# Patient Record
Sex: Female | Born: 1955 | Race: White | Hispanic: No | Marital: Married
Health system: Midwestern US, Community
[De-identification: ages and names within clinical notes are randomized; demographics above are authoritative.]

---

## 2018-07-27 DIAGNOSIS — R197 Diarrhea, unspecified: Secondary | ICD-10-CM

## 2018-07-27 NOTE — ED Notes (Signed)
Pt to er 18 via ems from home for c/o syncopal epidsode- EMS states pt has been dizzy all day and has had high blood pressure. Pt states she has had  Dizziness and diarrhea all month. Pt not sure if she hit her head- husband at bedside stated she was found on floor in hallway. EMS states her bp was 190/102 and gluc was 211.

## 2018-07-27 NOTE — ED Notes (Signed)
I have reviewed discharge instructions with the patient and spouse.  The patient and spouse verbalized understanding.

## 2018-07-27 NOTE — ED Provider Notes (Signed)
C/O DIARRHEA, SOFT AT TIMES WATERY STOOLS SINCE END OF AUGUST. NO FEVER/CHILLS, NO NAUSEA/VOMITING, NO CHANGE IN APPETITE, NO WEIGHT LOSS. NO BLOOD/MUCUS IN STOOLS. NO FOLLOWUP WITH PCP, HAS TAKEN OTC LOMOTIL. NO RELIEF. TODAY FELT LIGHTHEADED. AND TINGLING SENSATIONS IN FACE AND HADS, RESOLVED. LASTED FOR A FEW MINUTES.            Past Medical History:   Diagnosis Date   ??? CAD (coronary artery disease)    ??? Diabetes (HCC)     IIDM   ??? Hypertension    ??? Stroke [facility])        Past Surgical History:   Procedure Laterality Date   ??? CARDIAC SURG PROCEDURE UNLIST      heart stent x1   ??? HX CHOLECYSTECTOMY     ??? HX GYN      hysterectomy   ??? HX ORTHOPAEDIC      right hip replacement   ??? 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 No pertinent family history.    Social History     Socioeconomic History   ??? Marital status: MARRIED     Spouse name: Not on file   ??? Number of children: Not on file   ??? Years of education: Not on file   ??? Highest education level: Not on file   Occupational History   ??? Not on file   Social Needs   ??? Financial resource strain: Not on file   ??? Food insecurity:     Worry: Not on file     Inability: Not on file   ??? Transportation needs:     Medical: Not on file     Non-medical: Not on file   Tobacco Use   ??? Smoking status: Current Every Day Smoker     Packs/day: 2.00     Years: 35.00     Pack years: 70.00   ??? Smokeless tobacco: Never Used   Substance and Sexual Activity   ??? Alcohol use: Not Currently   ??? Drug use: Never   ??? Sexual activity: Not on file   Lifestyle   ??? Physical activity:     Days per week: Not on file     Minutes per session: Not on file   ??? Stress: Not on file   Relationships   ??? Social connections:     Talks on phone: Not on file     Gets together: Not on file     Attends religious service: Not on file     Active member of club or organization: Not on file     Attends meetings of clubs or organizations: Not on file     Relationship  status: Not on file   ??? Intimate partner violence:     Fear of current or ex partner: Not on file     Emotionally abused: Not on file     Physically abused: Not on file     Forced sexual activity: Not on file   Other Topics Concern   ??? Not on file   Social History Narrative   ??? Not on file         ALLERGIES: Patient has no known allergies.    Review of Systems   Gastrointestinal: Positive for diarrhea.   Neurological: Positive for dizziness.   All other systems reviewed and are negative.      Vitals:  07/27/18 2115 07/27/18 2130 07/27/18 2141 07/27/18 2145   BP: 117/54 117/55  134/63   Pulse: 68 70 68 71   Resp: 17 12 22 26    Temp:       SpO2: 93% 92% 95% 93%   Weight:       Height:                Physical Exam   Constitutional: She is oriented to person, place, and time. She appears well-developed and well-nourished. She is active.   HENT:   Head: Normocephalic.   Eyes: Pupils are equal, round, and reactive to light.   Cardiovascular: Normal rate, regular rhythm, normal heart sounds, intact distal pulses and normal pulses.   Pulmonary/Chest: Effort normal and breath sounds normal.   Abdominal: Soft. Bowel sounds are normal.   Neurological: She is alert and oriented to person, place, and time. She has normal strength.   Skin: Skin is warm, dry and intact. Capillary refill takes less than 2 seconds.   Psychiatric: She has a normal mood and affect. Her speech is normal and behavior is normal.        MDM  Number of Diagnoses or Management Options  Diagnosis management comments: PT REFUSES LABS, IV FLUIDS. INFORMED HER OF POSSIBILITY OF ABNORMAL ELECTROLYTES. PT WANTS MEDS AND WILL F/U WITH PCP IN AM. PT UNDERSTANDS RISKS OF WORSENING SYMPTOMS, DISABILITY.          Procedures

## 2018-07-27 NOTE — ED Provider Notes (Signed)
C/O DIARRHEA, SOFT AT TIMES WATERY STOOLS SINCE END OF AUGUST. NO FEVER/CHILLS, NO NAUSEA/VOMITING, NO CHANGE IN APPETITE, NO WEIGHT LOSS. NO BLOOD/MUCUS IN STOOLS. NO FOLLOWUP WITH PCP, HAS TAKEN OTC LOMOTIL. NO RELIEF. TODAY FELT LIGHTHEADED. AND TINGLING SENSATIONS IN FACE AND HADS, RESOLVED. LASTED FOR A FEW MINUTES.            Past Medical History:   Diagnosis Date   ??? CAD (coronary artery disease)    ??? Diabetes (HCC)     IIDM   ??? Hypertension    ??? Stroke [facility])        Past Surgical History:   Procedure Laterality Date   ??? CARDIAC SURG PROCEDURE UNLIST      heart stent x1   ??? HX CHOLECYSTECTOMY     ??? HX GYN      hysterectomy   ??? HX ORTHOPAEDIC      right hip replacement   ??? 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 No pertinent family history.    Social History     Socioeconomic History   ??? Marital status: MARRIED     Spouse name: Not on file   ??? Number of children: Not on file   ??? Years of education: Not on file   ??? Highest education level: Not on file   Occupational History   ??? Not on file   Social Needs   ??? Financial resource strain: Not on file   ??? Food insecurity:     Worry: Not on file     Inability: Not on file   ??? Transportation needs:     Medical: Not on file     Non-medical: Not on file   Tobacco Use   ??? Smoking status: Current Every Day Smoker     Packs/day: 2.00     Years: 35.00     Pack years: 70.00   ??? Smokeless tobacco: Never Used   Substance and Sexual Activity   ??? Alcohol use: Not Currently   ??? Drug use: Never   ??? Sexual activity: Not on file   Lifestyle   ??? Physical activity:     Days per week: Not on file     Minutes per session: Not on file   ??? Stress: Not on file   Relationships   ??? Social connections:     Talks on phone: Not on file     Gets together: Not on file     Attends religious service: Not on file     Active member of club or organization: Not on file     Attends meetings of clubs or organizations: Not on file      Relationship status: Not on file   ??? Intimate partner violence:     Fear of current or ex partner: Not on file     Emotionally abused: Not on file     Physically abused: Not on file     Forced sexual activity: Not on file   Other Topics Concern   ??? Not on file   Social History Narrative   ??? Not on file         ALLERGIES: Patient has no known allergies.    Review of Systems   Gastrointestinal: Positive for diarrhea.   Neurological: Positive for dizziness.   All other systems reviewed and are negative.      Vitals:  07/27/18 2115 07/27/18 2130 07/27/18 2141 07/27/18 2145   BP: 117/54 117/55  134/63   Pulse: 68 70 68 71   Resp: 17 12 22 26    Temp:       SpO2: 93% 92% 95% 93%   Weight:       Height:                Physical Exam   Constitutional: She is oriented to person, place, and time. She appears well-developed and well-nourished. She is active.   HENT:   Head: Normocephalic.   Eyes: Pupils are equal, round, and reactive to light.   Cardiovascular: Normal rate, regular rhythm, normal heart sounds, intact distal pulses and normal pulses.   Pulmonary/Chest: Effort normal and breath sounds normal.   Abdominal: Soft. Bowel sounds are normal.   Neurological: She is alert and oriented to person, place, and time. She has normal strength.   Skin: Skin is warm, dry and intact. Capillary refill takes less than 2 seconds.   Psychiatric: She has a normal mood and affect. Her speech is normal and behavior is normal.        MDM  Number of Diagnoses or Management Options  Diagnosis management comments: PT REFUSES LABS, IV FLUIDS. INFORMED HER OF POSSIBILITY OF ABNORMAL ELECTROLYTES. PT WANTS MEDS AND WILL F/U WITH PCP IN AM. PT UNDERSTANDS RISKS OF WORSENING SYMPTOMS, DISABILITY.          Procedures

## 2018-07-27 NOTE — ED Triage Notes (Addendum)
Pt to er 18 via ems from home for c/o syncopal epidsode- EMS states pt has been dizzy all day and has had high blood pressure. Pt states she has had  Dizziness and diarrhea all month. Pt not sure if she hit her head- husband at bedside stated she was found on floor in hallway. EMS states her bp was 190/102 and gluc was 211.

## 2018-07-28 ENCOUNTER — Inpatient Hospital Stay
Admit: 2018-07-28 | Discharge: 2018-07-28 | Disposition: A | Discharge status: Home / Self Care | Payer: BLUE CROSS/BLUE SHIELD | Attending: Emergency Medicine

## 2018-07-28 LAB — EKG 12-LEAD
Atrial Rate: 69 {beats}/min
Diagnosis: NORMAL
P Axis: 37 degrees
P-R Interval: 188 ms
Q-T Interval: 418 ms
QRS Duration: 88 ms
QTc Calculation (Bazett): 447 ms
R Axis: -5 degrees
T Axis: 46 degrees
Ventricular Rate: 69 {beats}/min

## 2018-07-28 LAB — EKG, 12 LEAD, INITIAL
Atrial Rate: 69 {beats}/min
Calculated P Axis: 37 degrees
Calculated R Axis: -5 degrees
Calculated T Axis: 46 degrees
Diagnosis: NORMAL
P-R Interval: 188 ms
Q-T Interval: 418 ms
QRS Duration: 88 ms
QTC Calculation (Bezet): 447 ms
Ventricular Rate: 69 {beats}/min

## 2018-07-28 MED ORDER — DIPHENOXYLATE-ATROPINE 2.5 MG-0.025 MG TAB
ORAL_TABLET | Freq: Three times a day (TID) | ORAL | 0 refills | Status: AC | PRN
Start: 2018-07-28 — End: ?

## 2020-02-26 IMAGING — CT CT Lumbar Spine W-O Contrast
3 of 4 series · 12 of 33 positions shown, 14 images · IV contrast (agent unspecified)
Comparison: none

CT Lumbar Spine W-O Contrast
INDICATION: Pain.                                                                        
 Pertinent History: Numbness and tightness to right foot, radiating up to knee. Symptoms   
 have resolved.   Diabetes, hypertension, neuropathy, fibromyalgia CAD, PVD, CVA, non      
 compliant with medications.                                                               
 Surgical History: Right hip, hysterectomy, left groin-multiple times due to necrotizing   
 fascitis.                                                                                 
 Cancer: None                                                                              
 Intravenous contrast: None                                                                
 Technologist Comments: None
TECHNIQUE: Routine without IV contrast.  Helical acquisition with sagittal and coronal    
 reformats.                                                                                
 Utilized dose reduction techniques include: Automated Exposure Control, vendor specific   
 iterative reconstruction technique

[Series 4: ax pre st · axial · non-contrast · 0.29mm/px · z∈[+581,+763]mm · 4 of 137 slices shown, 5 images]
[im 23/137  soft-tissue]
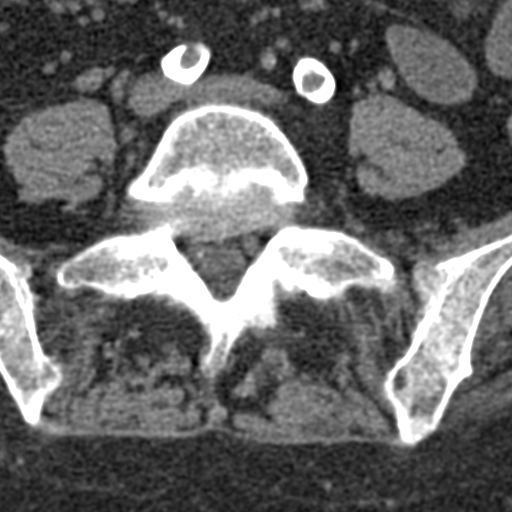
[im 23/137  bone]
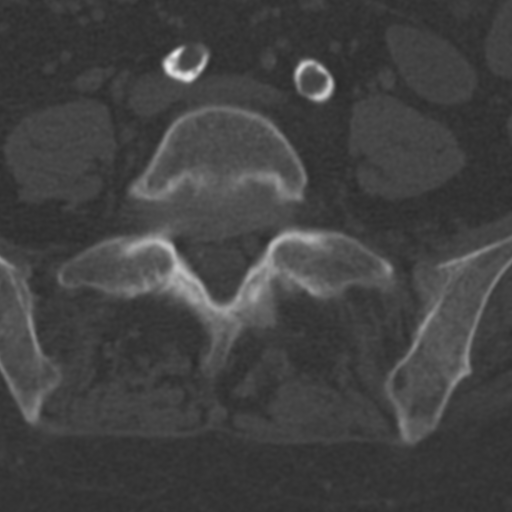
[im 46/137  bone]
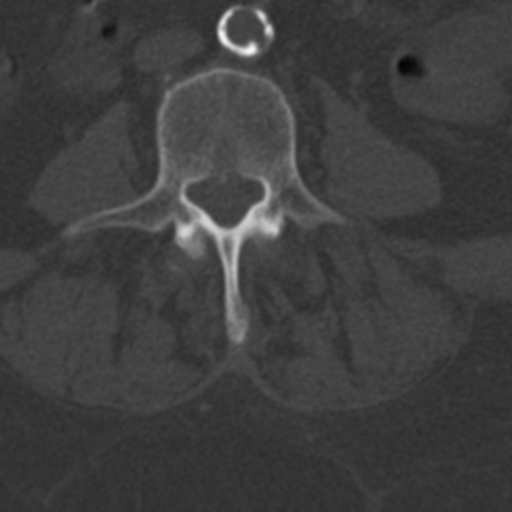
[im 91/137  bone]
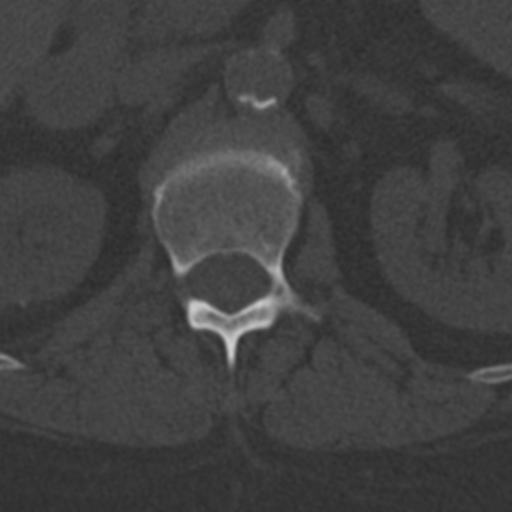
[im 114/137  bone]
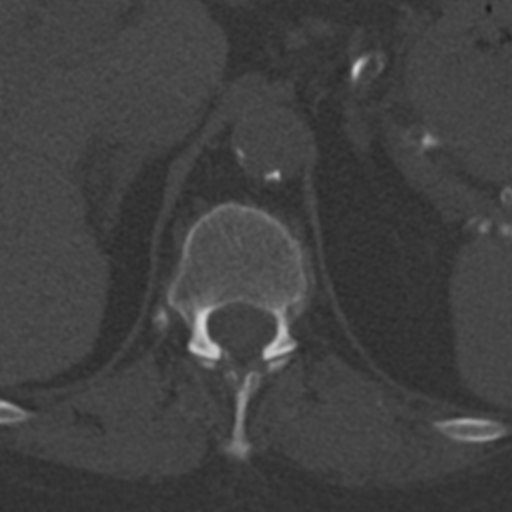

[Series 5: cor pre · coronal · non-contrast · 0.28mm/px · 3 of 63 slices shown]
[im 13/63  bone]
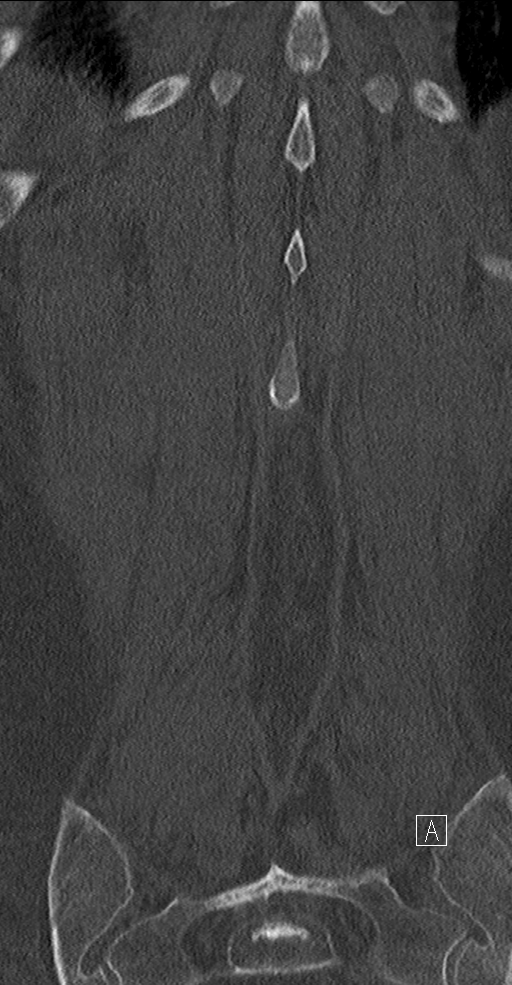
[im 25/63  bone]
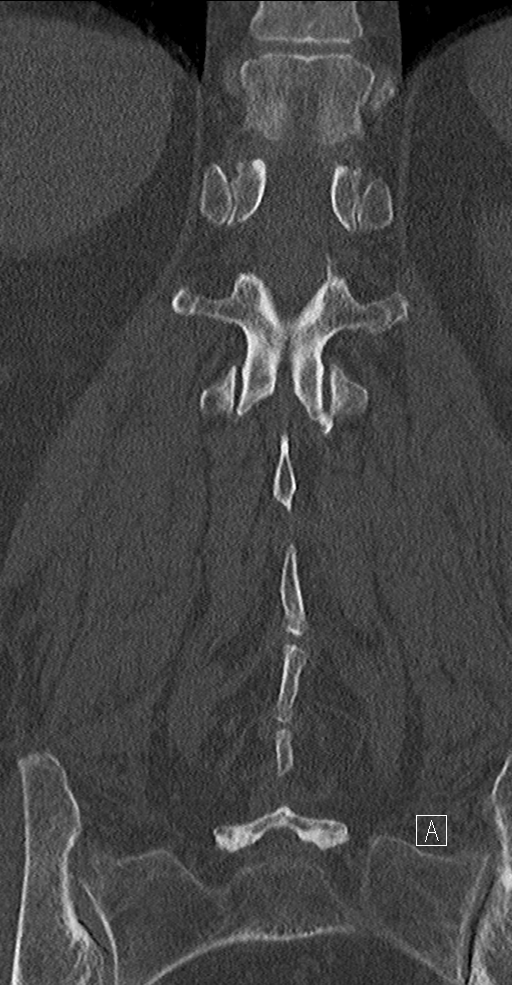
[im 38/63  bone]
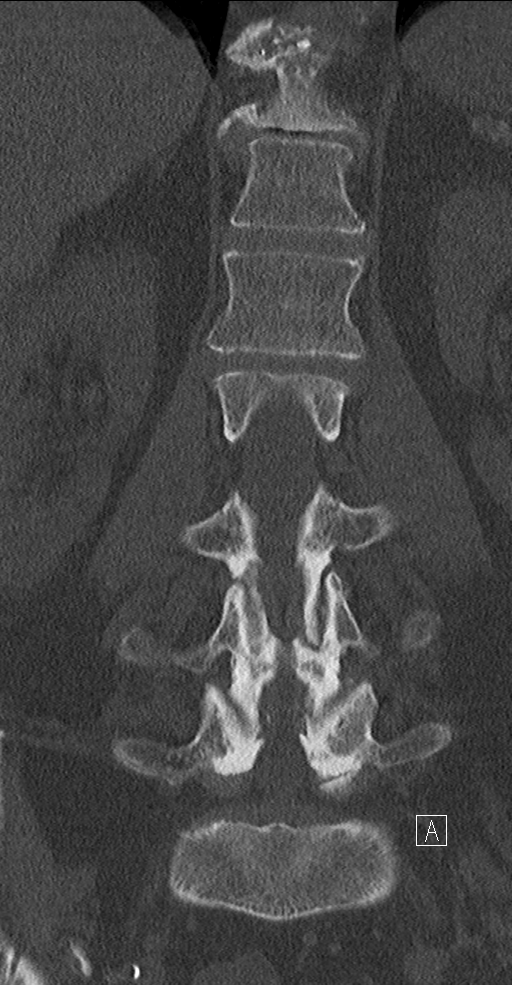

[Series 6: sag pre · sagittal · non-contrast · 0.50mm/px · 5 of 69 slices shown, 6 images]
[im 23/69  bone]
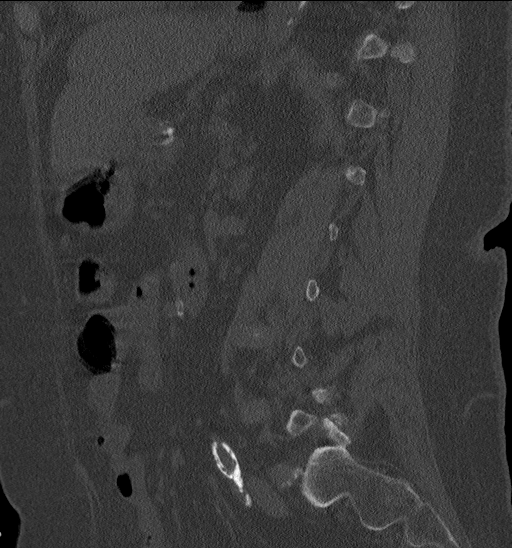
[im 29/69  bone]
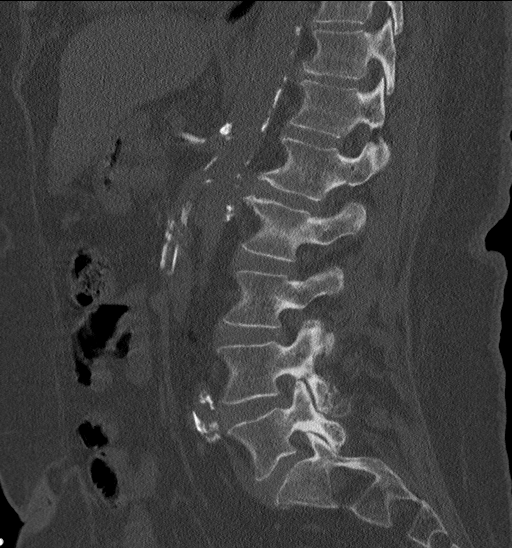
[im 35/69  soft-tissue]
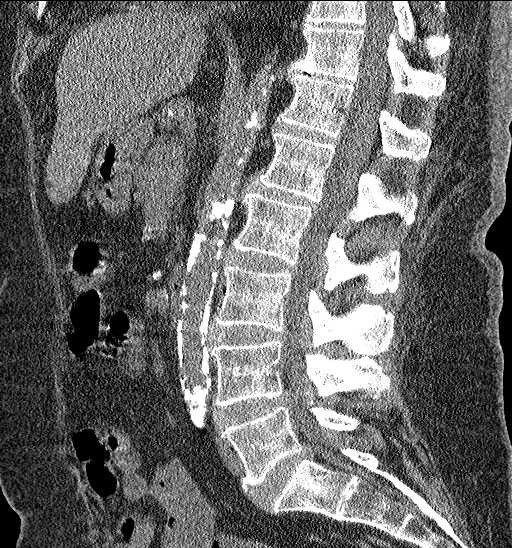
[im 35/69  bone]
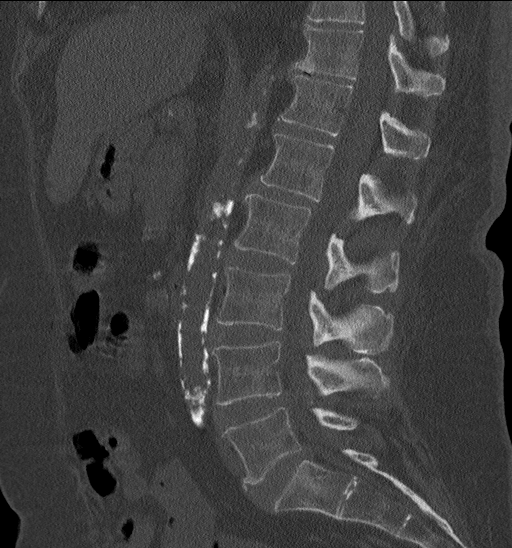
[im 40/69  bone]
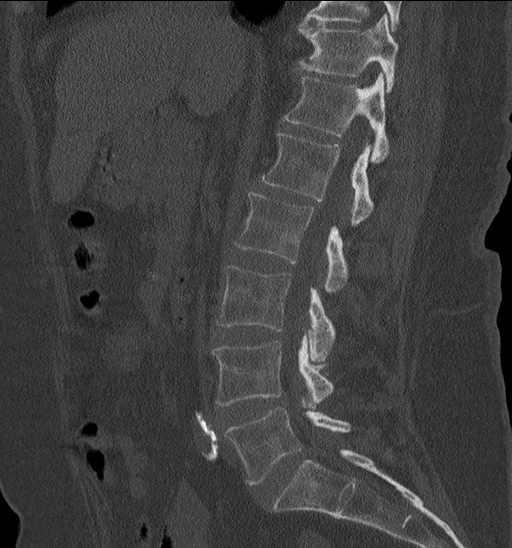
[im 46/69  bone]
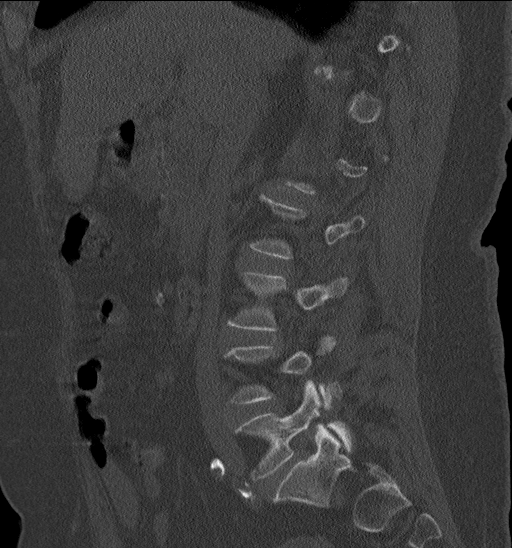

[12 of 33 positions shown; findings below may reference images not displayed]

FINDINGS: Lumbar vertebral bodies are normal in height. There is no evidence of an acute            
 fracture.                                                                                 
 Spine labeling assumes there are 5 lumbar type vertebral bodies. There is minimal grade 1 
 L1 on L2 retrolisthesis. Extensive calcific aortoiliac atherosclerosis with chronic       
 occlusion/near occlusion of the origin of the right common iliac artery.                  
 Mild to moderate anterior spondylosis at T10 T11 and T11-T12. Minimal anterior            
 spondylosis at T12-L1. L3-L4 through L5-S1 facet arthropathy and ligamentum flavum        
 thickening. There are small disc bulges at L3-L4 and L4-L5 with mild narrowing of the     
 central canal and neural foramina.                                                        
 Disc bulge at L5-S1 facet arthropathy results in mild foraminal stenosis at this level as 
 well.                                                                                     
 Degenerative changes present within the sacroiliac joints.
IMPRESSION: 1. Mild multilevel lumbar spondylosis.                                                    
 2. No acute fracture.                                                                     
 3. Extensive calcific aortoiliac atherosclerosis with chronic occlusion/near occlusion of 
 the origin of the right iliac artery.

## 2020-04-05 IMAGING — CT CTA Abdomen Aorta-Runoff
2 of 4 series · 9 of 16 positions shown, 14 images · IV contrast (omnipaque)
Comparison: none

CTA Abdomen Aorta-Runoff
INDICATION: Claudication.                                                                
 Pertinent History: patient reports numbness, tingling, pains  Diabetes, hypertension,     
 neuropathy, fibromyalgia CAD, PVD, CVA, non compliant with medications.                   
 Surgical History: Right hip, hysterectomy, left groin-multiple times due to necrotizing   
 fascitis.                                                                                 
 Cancer: None                                                                              
 GFR (past 30 days): >39 ml/min                                                            
 Metformin: None                                                                           
 Lipase: N/A       Amylase: N/A      WBC: N/A                                              
 IV Contrast: 100 mL Omnipaque 350                                                         
 Technologist Comments: None
TECHNIQUE: Helical acquisition with sagittal and coronal reformations; post processing on 
 independent workstation including 3-D volume rendering; pre and post IV contrast          
 images.                                                                                   
 Utilized dose reduction techniques include: Automated Exposure Control, vendor specific   
 iterative reconstruction technique

[Series 4: ax post (id) · axial · 0.82mm/px · z∈[-1034,-62]mm · 7 of 866 slices shown, 12 images]
[im 109/866  soft-tissue]
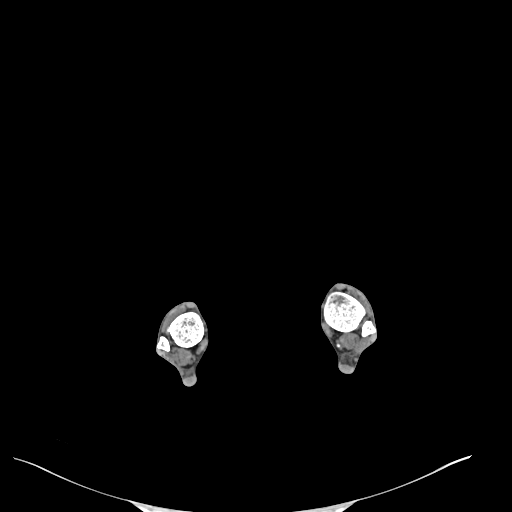
[im 109/866  bone]
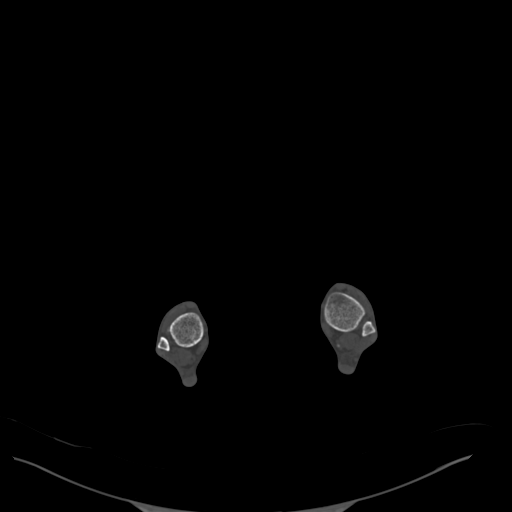
[im 217/866  soft-tissue]
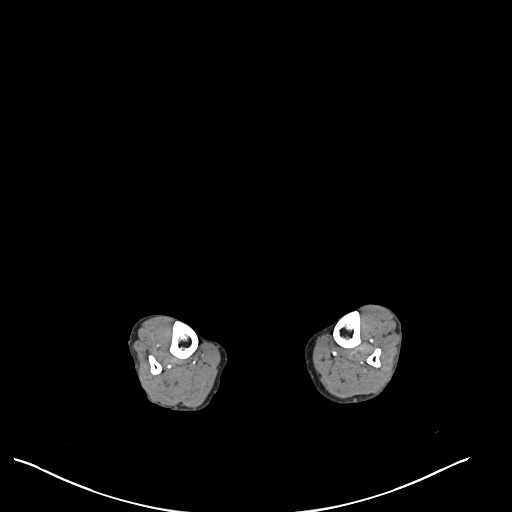
[im 325/866  soft-tissue]
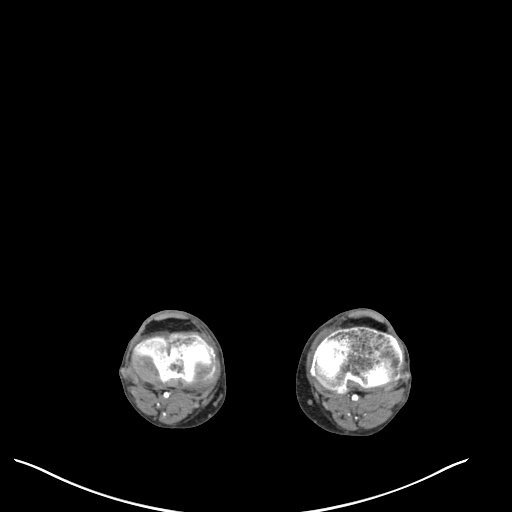
[im 433/866  soft-tissue]
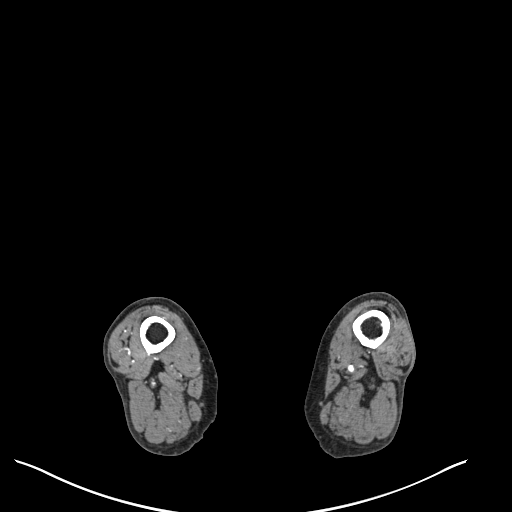
[im 433/866  lung]
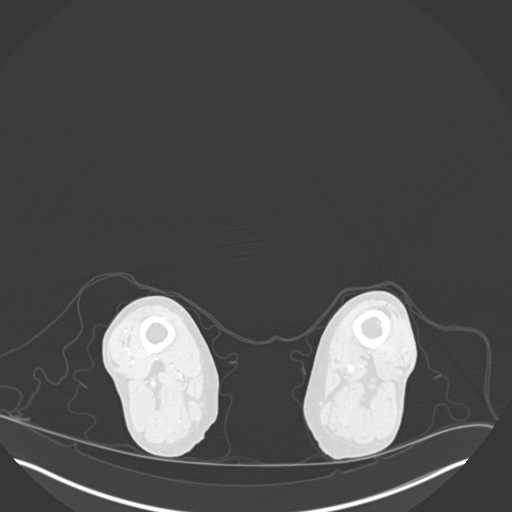
[im 541/866  soft-tissue]
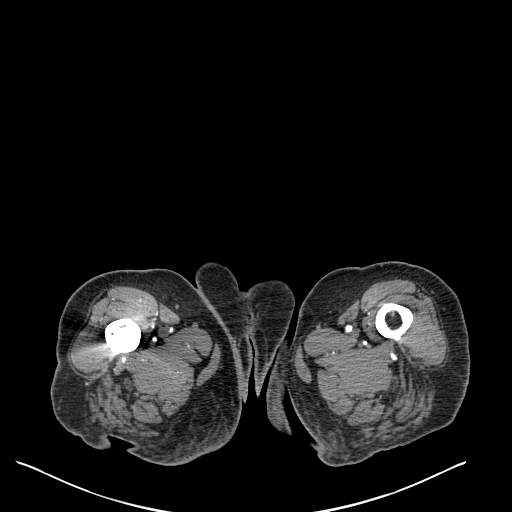
[im 541/866  lung]
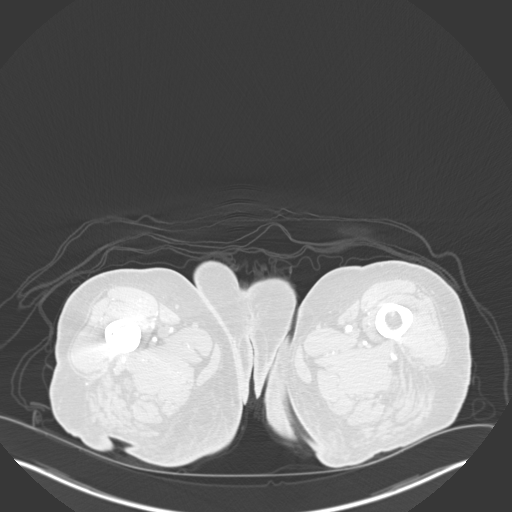
[im 649/866  soft-tissue]
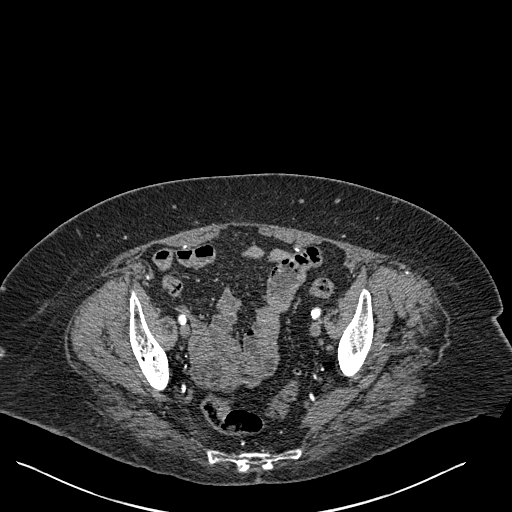
[im 649/866  lung]
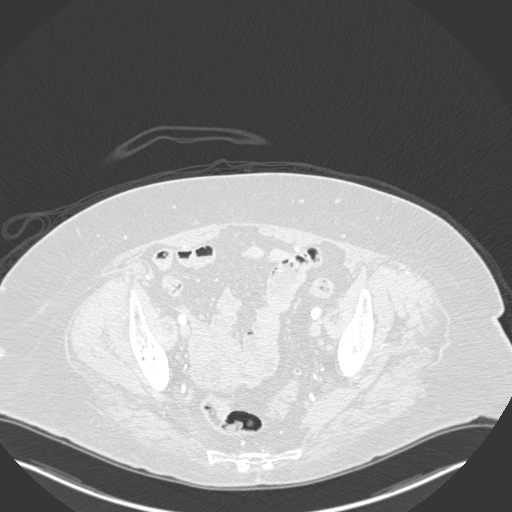
[im 757/866  soft-tissue]
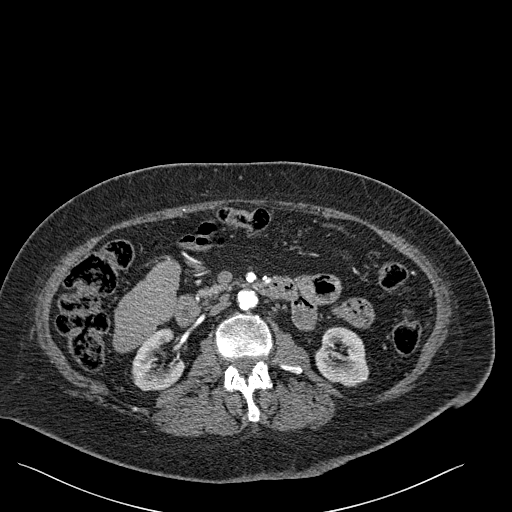
[im 757/866  lung]
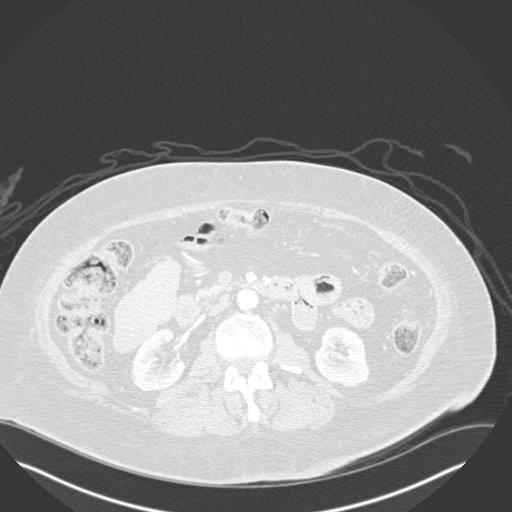

[Series 6: delayed calves · axial · 0.70mm/px · z∈[-1002,-804]mm · 2 of 398 slices shown]
[im 133/398  soft-tissue]
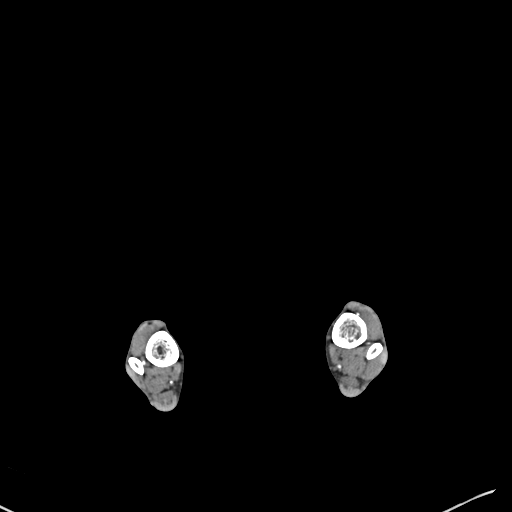
[im 265/398  soft-tissue]
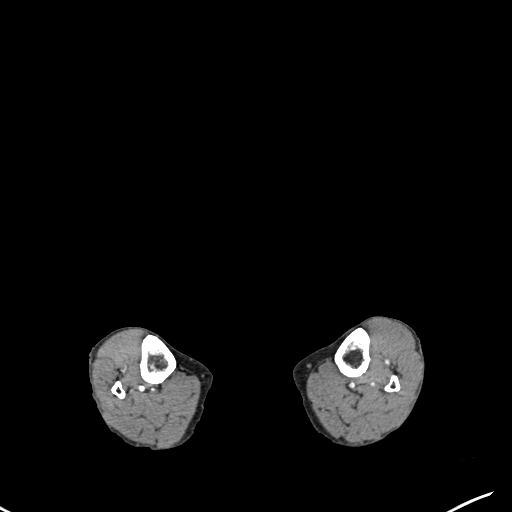

[9 of 16 positions shown; findings below may reference images not displayed]

FINDINGS: There is moderate calcified plaque within the abdominal aorta. There is calcified plaque  
 at the origin of the celiac artery and SMA without definite stenosis. There is moderate   
 calcified plaque at origin left renal artery without stenosis. The right middle artery is 
 patent. The inferior mesenteric artery is patent.                                         
 There is an inferior right accessory renal artery.                                        
 Right leg: There is a short segment occlusion is 99% stenosis of the proximal right       
 common iliac artery, there is reconstitution of the right common iliac artery distally.   
 There is heavy calcified plaque throughout the right common iliac artery, internal iliac  
 artery and external iliac artery with a probable moderate-severe stenosis at the origin   
 of the right external iliac artery. There is also a likely moderate-severe stenosis in    
 the distal right external iliac artery. There is moderate calcified plaque with probable  
 50% stenosis in the right common femoral artery. The right profundofemoral artery is      
 patent. There is a greater than 90% stenosis at the origin of the right SFA with several  
 additional severe tandem stenoses proximally and at the mid aspect. The right SFA         
 occludes distally. The right popliteal artery is patent. There is minimal calcified       
 plaque in the below the knee right popliteal artery. The trifurcation vessels are patent  
 proximally. The NG tube artery is small in size includes proximally. The differential can 
 include a congenital variant. The peroneal artery is hypertrophic and continues evidence  
 to the talus pedis at the ankle and foot. The posterior tibial artery also continues into 
 the foot.                                                                                 
 Left leg: There is moderate calcified plaque in the left common iliac artery which likely 
 results in a 60% stenosis. There is a moderate stenosis at the origin of the left         
 internal iliac artery. There is a severe stenosis at the origin of the left external      
 iliac artery. There is heavy calcified plaque in the left common femoral artery which     
 results in 50% stenosis. The left profundofemoral artery is patent.                       
 There is a 70% stenosis at the origin of the left SFA. There is a tandem 70% stenosis in  
 the proximal left SFA. The left popliteal artery is patent. The trifurcation vessels are  
 patent proximally. The trifurcation vessels continue into the foot.                       
 There is a right hip arthroplasty.                                                        
 Additional findings:                                                                      
 The heart is not enlarged. There is a small hiatal hernia.                                
 The liver has a nodular contour consistent with cirrhosis. The spleen is heterogeneous    
 enhancement. The spleen is normal in size. There is mild nodular thickening of both       
 adrenal glands.                                                                           
 The pancreas is unremarkable.                                                             
 The gallbladder surgically absent. There is moderate extrahepatic biliary duct dilatation 
 most consistent with a cholecystectomy state.                                             
 The kidneys are symmetric in size. It appears somewhat small in size. There is mild       
 cortical thinning. This calcification noted in the right renal parenchyma. There is no    
 collecting system stone. There is no hydronephrosis or hydroureter. The urinary bladder   
 is collapsed. The uterus is surgically absent. The adnexa are surgically absent.          
 There is a tiny fat-containing periumbilical hernia.                                      
 There are no abnormally dilated small or large bowel loops. There are scattered colonic   
 diverticula without evidence of diverticulitis. The appendix is normal.                   
 There is no significant retroperitoneal or mesenteric lymphadenopathy. There is no free   
 fluid or free air.                                                                        
 The vertebral body heights are maintained.
IMPRESSION: 1. Right leg: 99% stenosis versus short segment occlusion of the proximal right common    
 iliac artery. Moderate-severe stenoses noted in the right external iliac artery. 90%      
 stenosis at the origin of the right SFA. The right SFA occludes distally. The popliteal   
 artery is patent with minimal atherosclerotic plaque. The posterior tibial artery and     
 peroneal artery are patent and continue to the foot.                                      
 2. Left leg: 60% stenosis in the left common iliac artery. Severe stenosis in the         
 proximal left external iliac artery. 70% stenosis at the origin of the left SFA.          
 Additional 70% stenoses noted in the proximal left SFA. The popliteal artery is patent.   
 There is three-vessel runoff to left foot.                                                
 3. Nodular contour to the liver suggesting cirrhosis.                                     
 4. Colonic diverticula.

## 2020-07-07 IMAGING — MR MR Brain W W-O Contrast
8 of 14 series · 11 of 48 positions shown · IV contrast (DOTAREM)
Comparison: 01/06/2014

MR Brain W W-O Contrast
HISTORY: Dizziness
TECHNIQUE: Sagittal T1.                                                                              
 Axial T1, T2, FLAIR, GRE T2, diffusion.                                                   
 Coronal post contrast fat sat T1. Axial 3D GRE post contrast T1.                          
 Contrast: 16 cc Dotarem IV

[Series 5: DWI · axial · 5.0mm · 1.20mm/px · z∈[-69,+71]mm · 2 of 50 slices shown (1 of 2)]
[im 1/50]
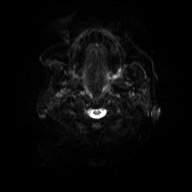
[im 50/50]
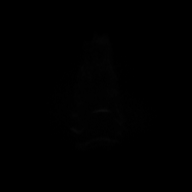

[Series 6: DWI · axial · 5.0mm · 1.20mm/px · 1 of 25 slices shown (2 of 2)]
[im 1/25]
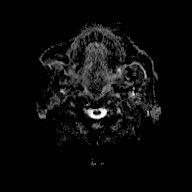

[Series 7: FLAIR · sagittal · 5.0mm · 0.38mm/px · 1 of 23 slices shown (1 of 2)]
[im 1/23]
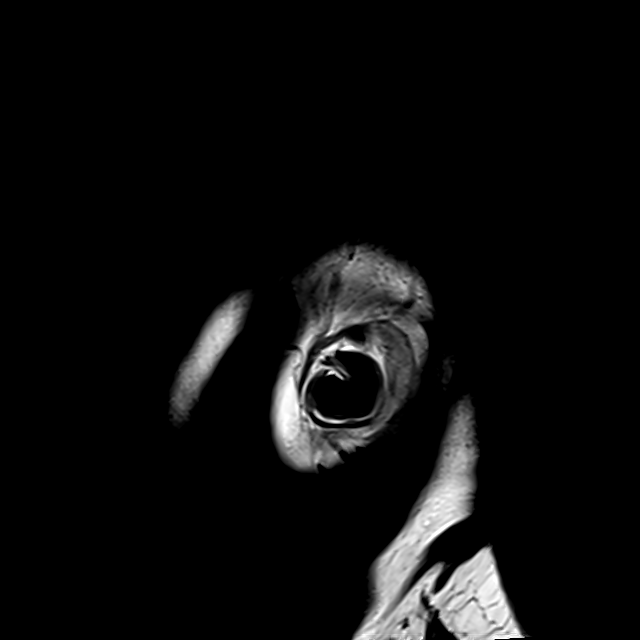

[Series 8: FLAIR · axial · 5.0mm · 0.45mm/px · 1 of 25 slices shown (2 of 2)]
[im 1/25]
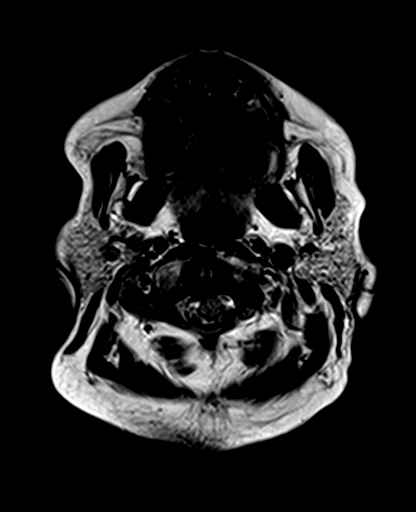

[Series 9: T2 · axial · 5.0mm · 0.26mm/px · 1 of 25 slices shown]
[im 1/25]
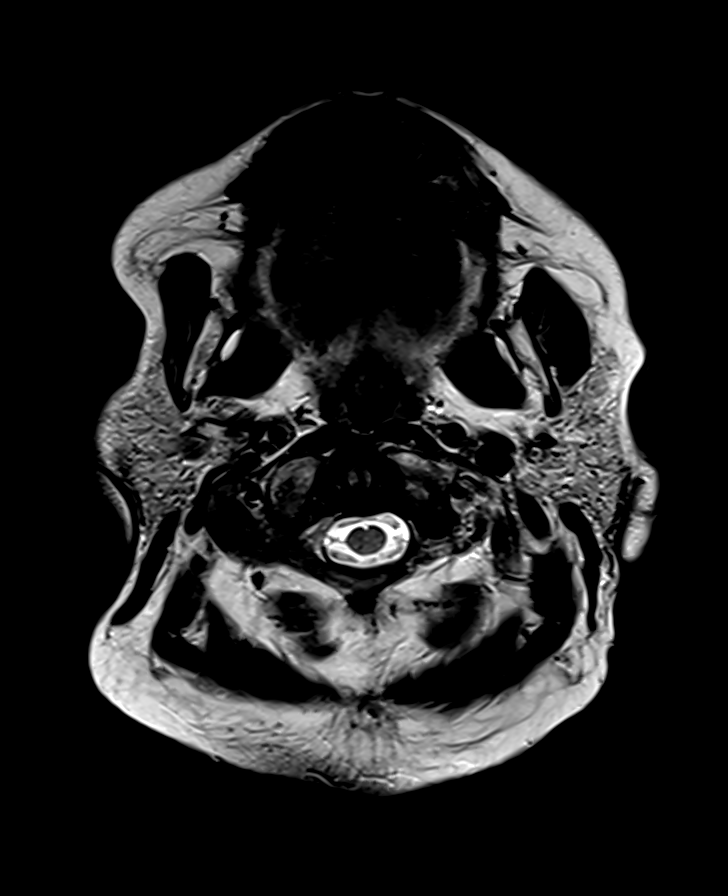

[Series 10: ax gr · axial · 5.0mm · 0.45mm/px · 1 of 25 slices shown]
[im 1/25]
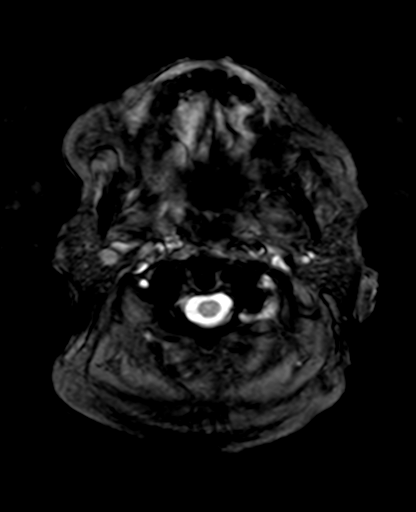

[Series 11: T1 · axial · 5.0mm · 0.72mm/px · z∈[-66,+73]mm · 2 of 25 slices shown]
[im 1/25]
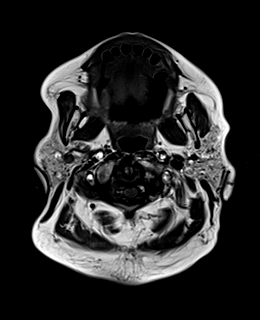
[im 25/25]
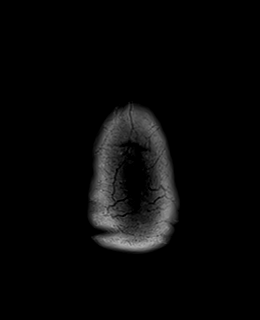

[Series 12: T1 fat-sat post-contrast · coronal · 5.0mm · 0.72mm/px · 2 of 30 slices shown]
[im 1/30]
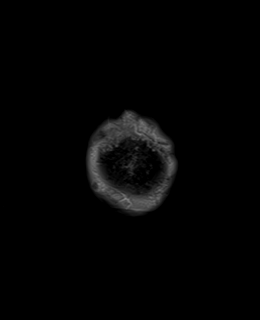
[im 30/30]
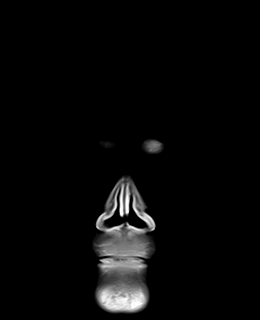

[11 of 48 positions shown; findings below may reference images not displayed]

FINDINGS: BRAIN PARENCHYMA: Small focus of restricted diffusion in the lateral right basal ganglion 
 without enhancement, consistent with acute infarct. Old bilateral thalamic infarcts.      
 Scattered periventricular and subcortical white matter T2/FLAIR hyperintensities,         
 nonspecific but likely representing chronic small vessel ischemic changes.                
 VENTRICLES: Unremarkable.                                                                 
 SULCI AND SUBARACHNOID SPACE: Unremarkable.  No extra-axial collection.                   
 FLOW VOIDS: Present.                                                                      
 MIDLINE CRANIOCERVICAL STRUCTURES: Within normal limits.                                  
 BONE MARROW: No suspicious focal abnormality.                                             
 Partial fluid opacification of the left mastoid.
IMPRESSION: 1.  Acute small right basal ganglial infarct.                                             
 2.  Old bilateral thalamic infarcts.                                                      
 3.  Atrophy and probable chronic small vessel ischemic white matter changes.              
 4.  Left mastoiditis.

## 2020-07-19 IMAGING — CT CT Chest Lung Screen
1 of 4 series · 12 of 36 positions shown, 15 images · IV contrast (agent unspecified)
Comparison: CTA chest 05/10/2016.

CT CHEST LUNG SCREEN.
INDICATION: 45 pack-year smoking history.                                                 
 Pertinent History: Lung screening. Cardiac stents  Diabetes. Hypertension.                
 Smoking status: Current. Pack-year history: 45.                                           
 Cancer: None                                                                              
 Contrast: No IV contrast                                                                  
 Technologist Comments: None
TECHNIQUE: Low dose (LD) CT chest. Helical acquisition utilizing low dose technique for   
 screening purposes including sagittal and coronal reformats.                              
 Utilized dose reduction techniques include: Automated Exposure Control, vendor specific   
 iterative reconstruction technique.

[Series 2: ax pre (id) · axial · non-contrast · 0.62mm/px · z∈[-71,+212]mm · 12 of 211 slices shown, 15 images]
[im 11/211  mediastinal]
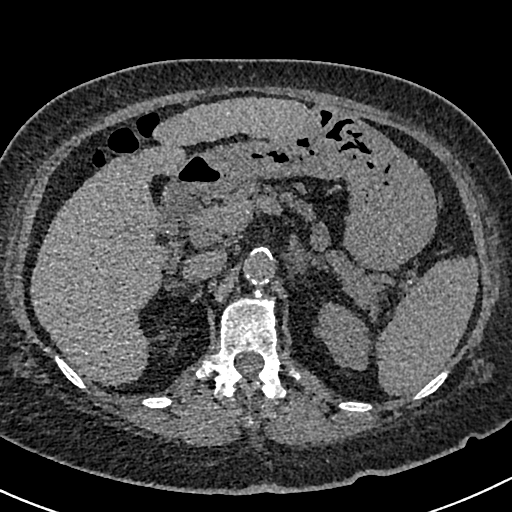
[im 11/211  lung]
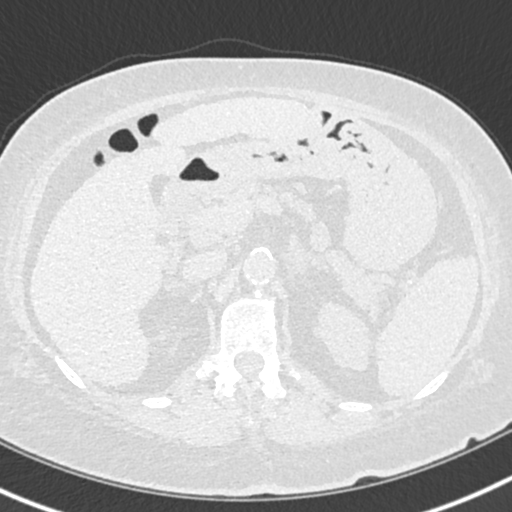
[im 32/211  lung]
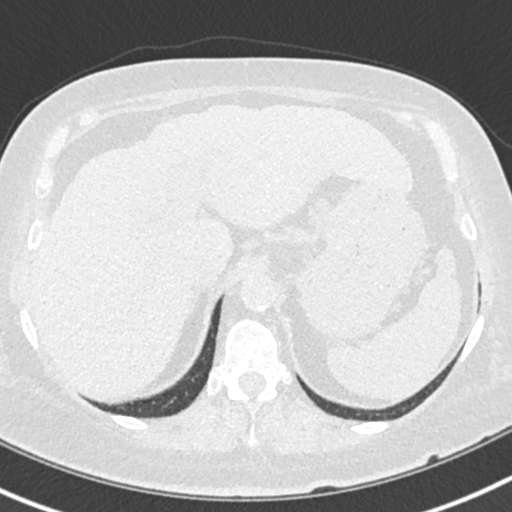
[im 43/211  lung]
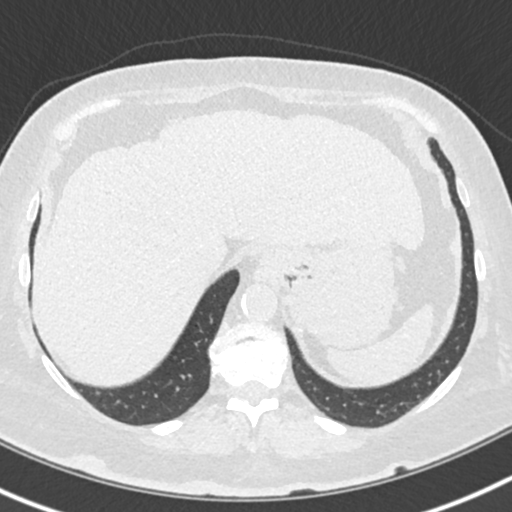
[im 64/211  lung]
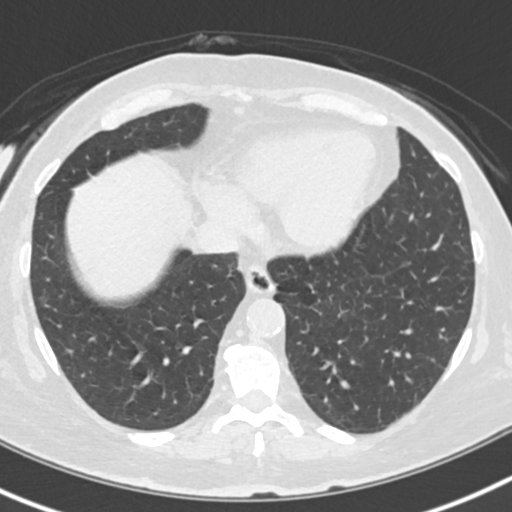
[im 85/211  mediastinal]
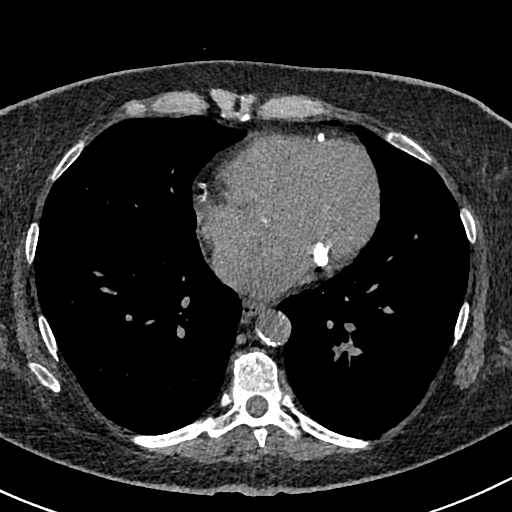
[im 85/211  lung]
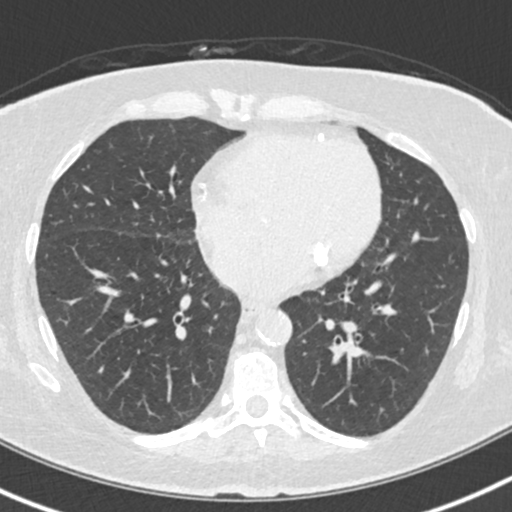
[im 95/211  lung]
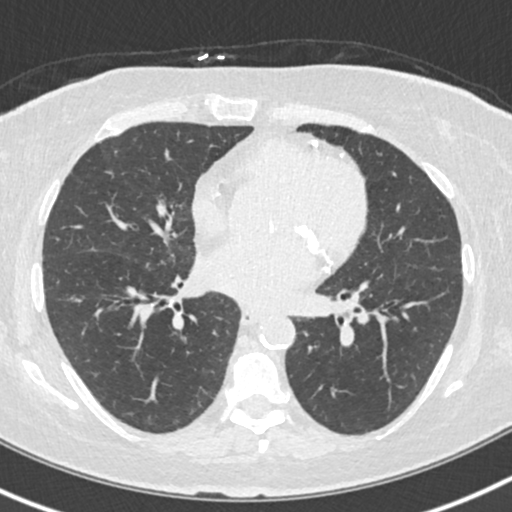
[im 116/211  lung]
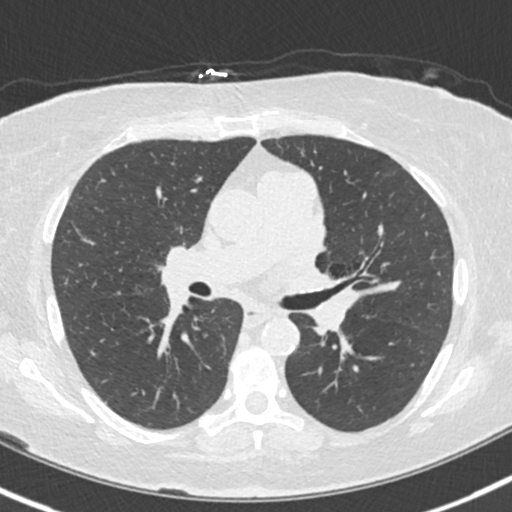
[im 127/211  lung]
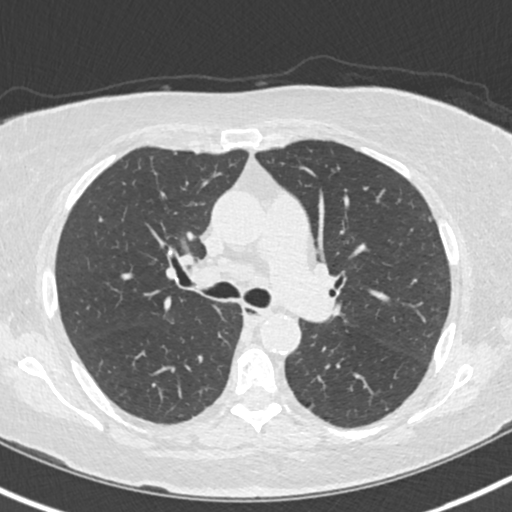
[im 148/211  mediastinal]
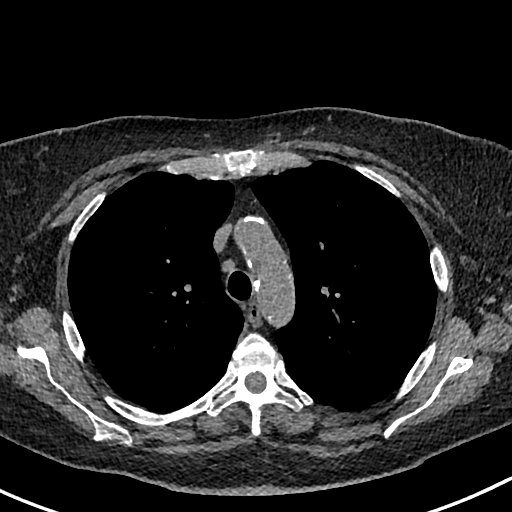
[im 148/211  lung]
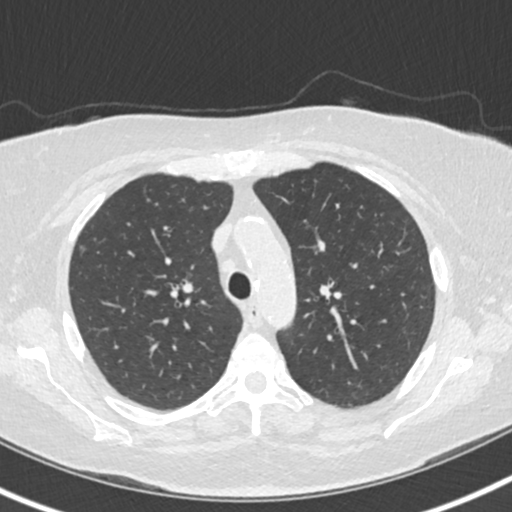
[im 169/211  lung]
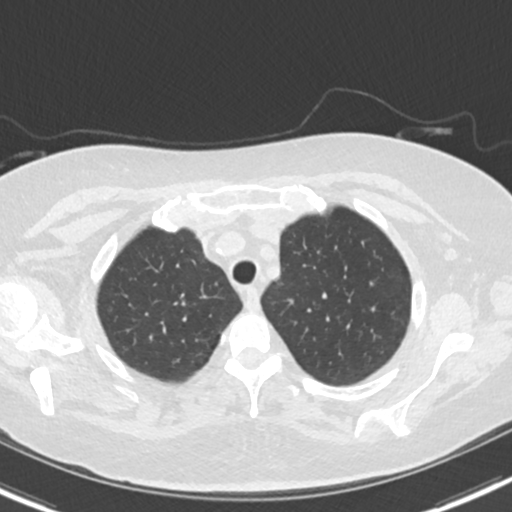
[im 179/211  lung]
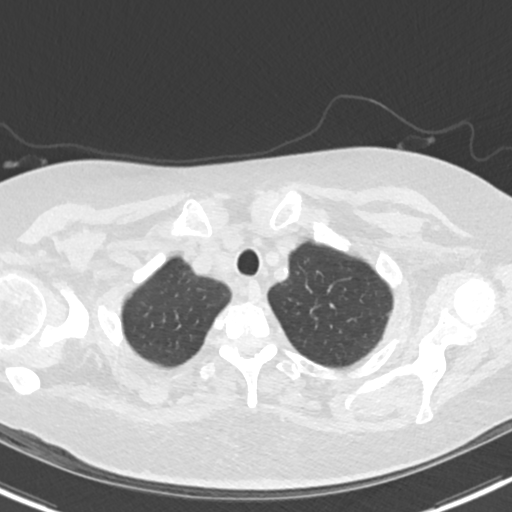
[im 200/211  lung]
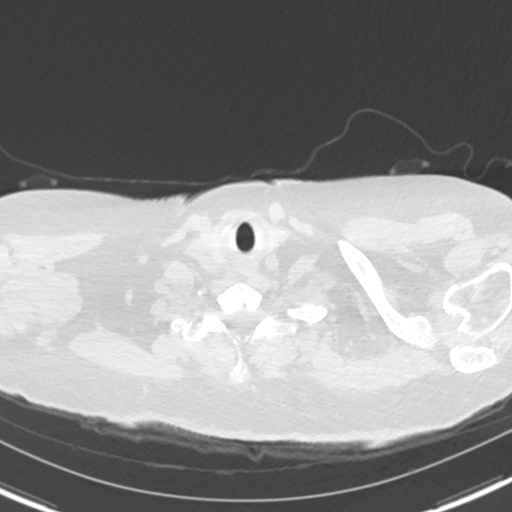

[12 of 36 positions shown; findings below may reference images not displayed]

FINDINGS: Soft tissue density at the right posterior thyroid redemonstrated extending     
 toward the esophagus, possibly an exophytic thyroid nodule given appearance on prior CT.  
 Enlarged mediastinal nodes have not changed. A node anterior to the carina crossing       
 midline is 2.3 x 1.3 cm a location that it previously measured 2.3 x 1.5 cm, grossly      
 unchanged. Heavy diffuse atherosclerotic coronary arterial calcification is present.      
 Mitral valve calcifications noted. Moderate thoracic aortic atherosclerotic calcification 
 is present. The heart is normal in size.                                                  
 The liver is cirrhotic. Liver is incompletely included within the imaged volume.          
 Gallbladder is absent.                                                                    
 The airway is patent with bronchial wall calcification. The right lung demonstrates a     
 tiny 3 mm noncalcified upper lobe pulmonary nodule (image #55 series 2), unchanged in     
 retrospect. Subpleural posterior 3 mm noncalcified right upper lobe pulmonary nodule is   
 within the area that was obscured by dependent atelectasis on the prior examination. A    
 third 3 mm noncalcified right upper lobe pulmonary nodule (image #51) is noted. Right     
 apical to have a 3 mm noncalcified pulmonary nodule (image #42) is stable in              
 retrospect.                                                                               
 The left lung demonstrates of renal                                                       
 Noncalcified upper lobe pulmonary nodule (image #41). Has this that was previously seen   
 adjacent to that nodule has resolved. A new left upper lobe pulmonary cysts measuring     
 approximately 1 cm is noted. Within the left lower lobe there is a 5 mm pulmonary nodule  
 which is increased in size (previously 2-3 mm (image #111 series). Additional scattered   
 <5 mm left-sided pulmonary nodules noted. Negative for pleural effusion or pneumothorax   
 on the left.                                                                              
 Negative for acute osseous abnormality.                                                   
 ___________
IMPRESSION: Left lower lobe solid 5 mm subpleural pulmonary nodule is increased in size   
 from 05/10/2016.                                                                          
 LUNG-RADS Category: 4A: Suspicious. 3 month follow up CT chest without IV contrast;       
 PET/CT may be used when there is a > or = 8 mm solid component                            
 ACR Lung-RADS v. 1.1 (released 3737)                                                      
 CATEGORY 4A (SUSPICIOUS)
FINDINGS: - solid nodule(s):                                                                        
 > or = 8 mm to < 15 mm at baseline OR                                                     
 growing < 8 mm OR                                                                         
 new 6 to < 8 mm                                                                           
 - part solid nodule(s):                                                                   
 > or = 6 mm with solid component > or = 6 mm to < 8 mm OR                                 
 with a new or growing < 4 mm solid component                                              
 - endobronchial nodule                                                                    
 Management: 3 month LDCT; PET/CT may be used when there is a > or = 8 mm solid            
 component                                                                                 
 Probability of malignancy: 5-15%

## 2021-01-15 IMAGING — CR DX Humerus LT
1 series · 2 of 2 positions shown · non-contrast
Comparison: Left shoulder on November 18, 2015.

DX Humerus LT
TECHNIQUE: Left humerus, 2 views
HISTORY: Left shoulder pain.

[Series 1: ap wall · 0.10mm/px · 2 of 2 slices shown]
[im 1/2]
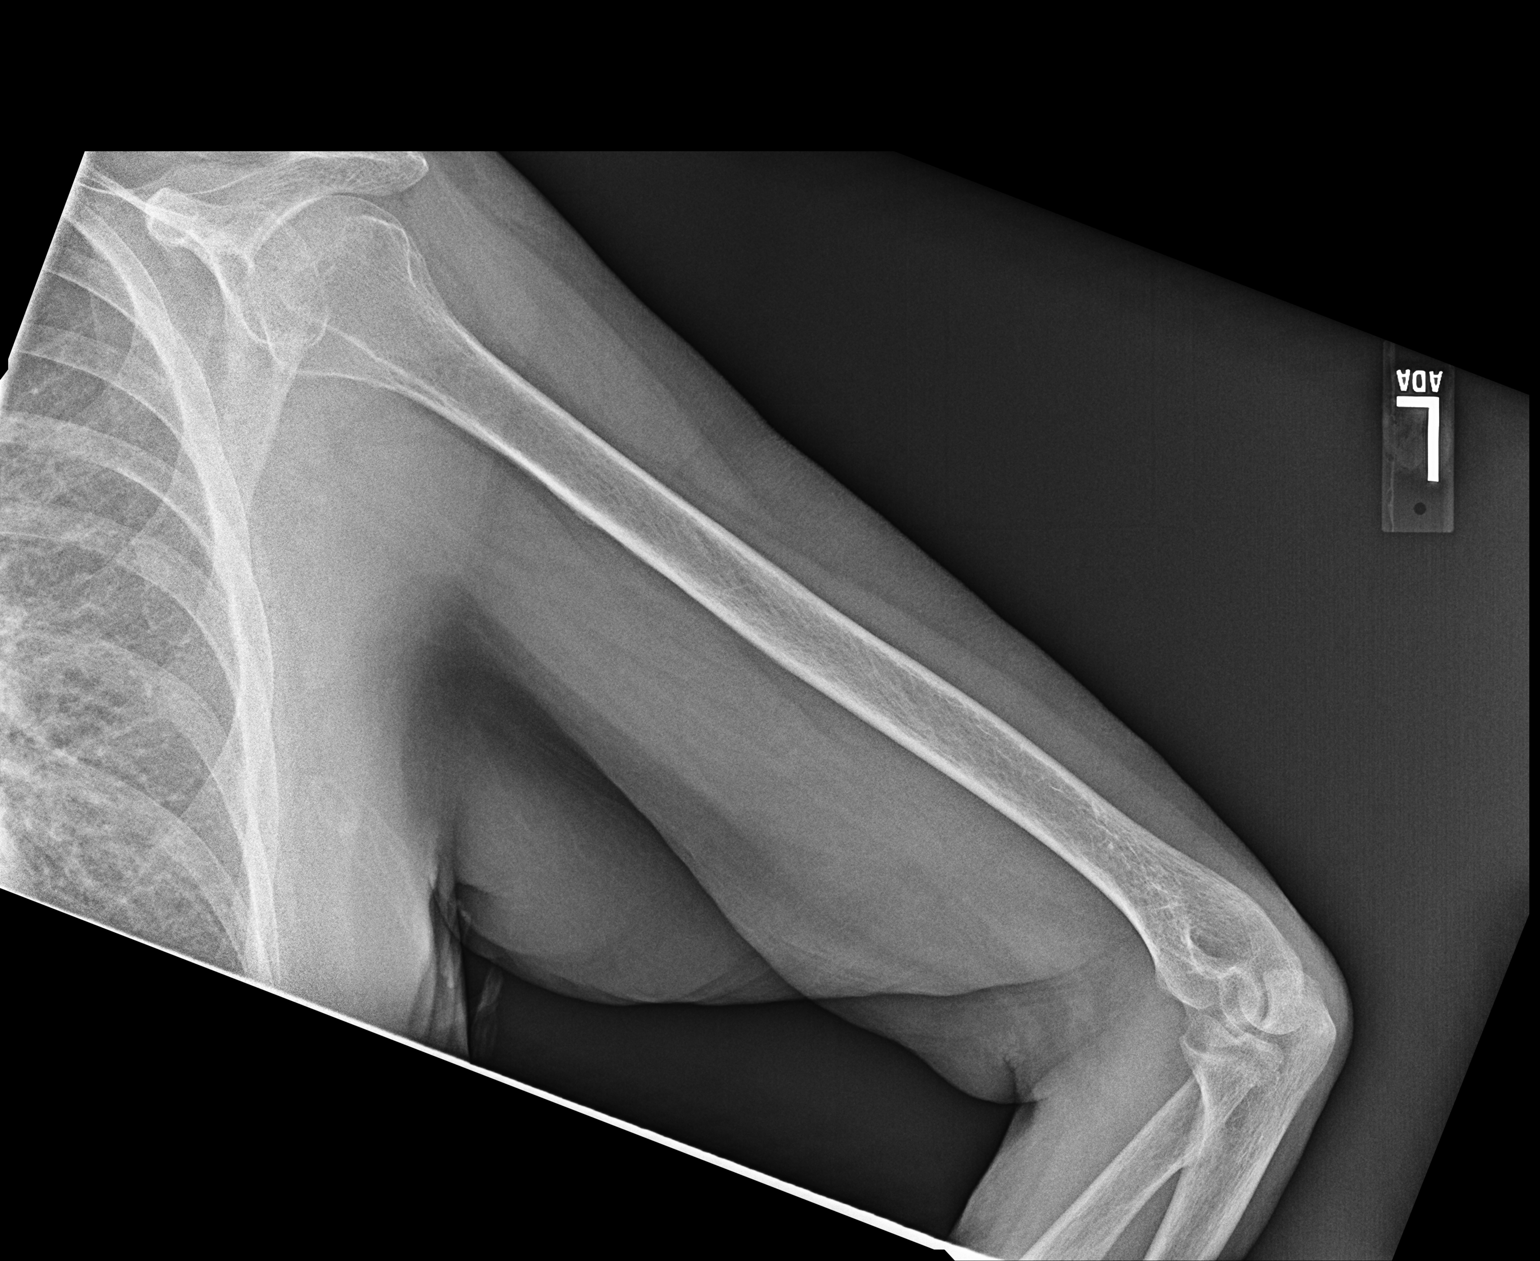
[im 2/2]
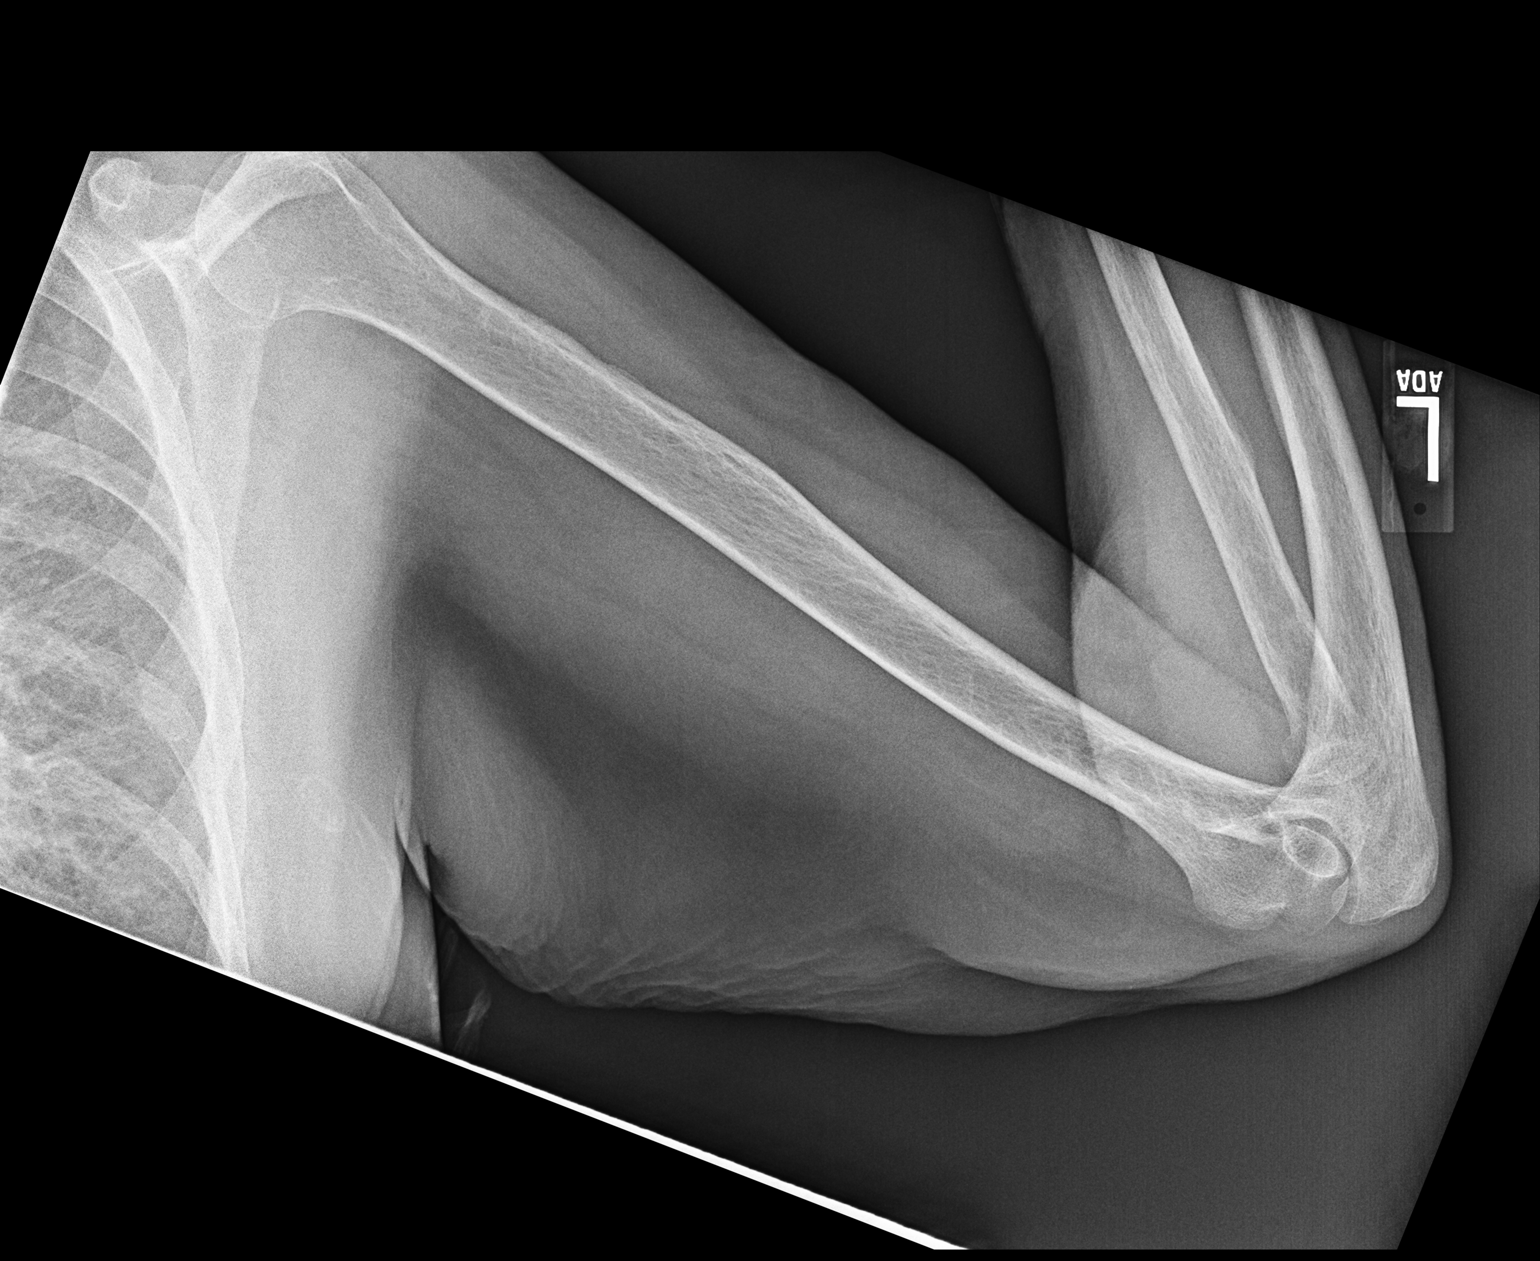

[2 of 2 positions shown; findings below may reference images not displayed]

FINDINGS: There are moderate degenerative changes at the AC joint. The left humerus                 
 is                                                                                        
 intact.
IMPRESSION: No acute osseous abnormality.

## 2021-01-15 IMAGING — CR DX Shoulder Complete LT
1 series · 4 of 4 positions shown · non-contrast
Comparison: None available.

EXAM: DX Shoulder Complete LT                                                             
 DATE: January 15, 2021
HISTORY: Left shoulder pain
TECHNIQUE: AP, Grashey, scapular Y, axillary views of the left shoulder.

[Series 1: ap neutral wall · 0.10mm/px · 4 of 4 slices shown]
[im 1/4]
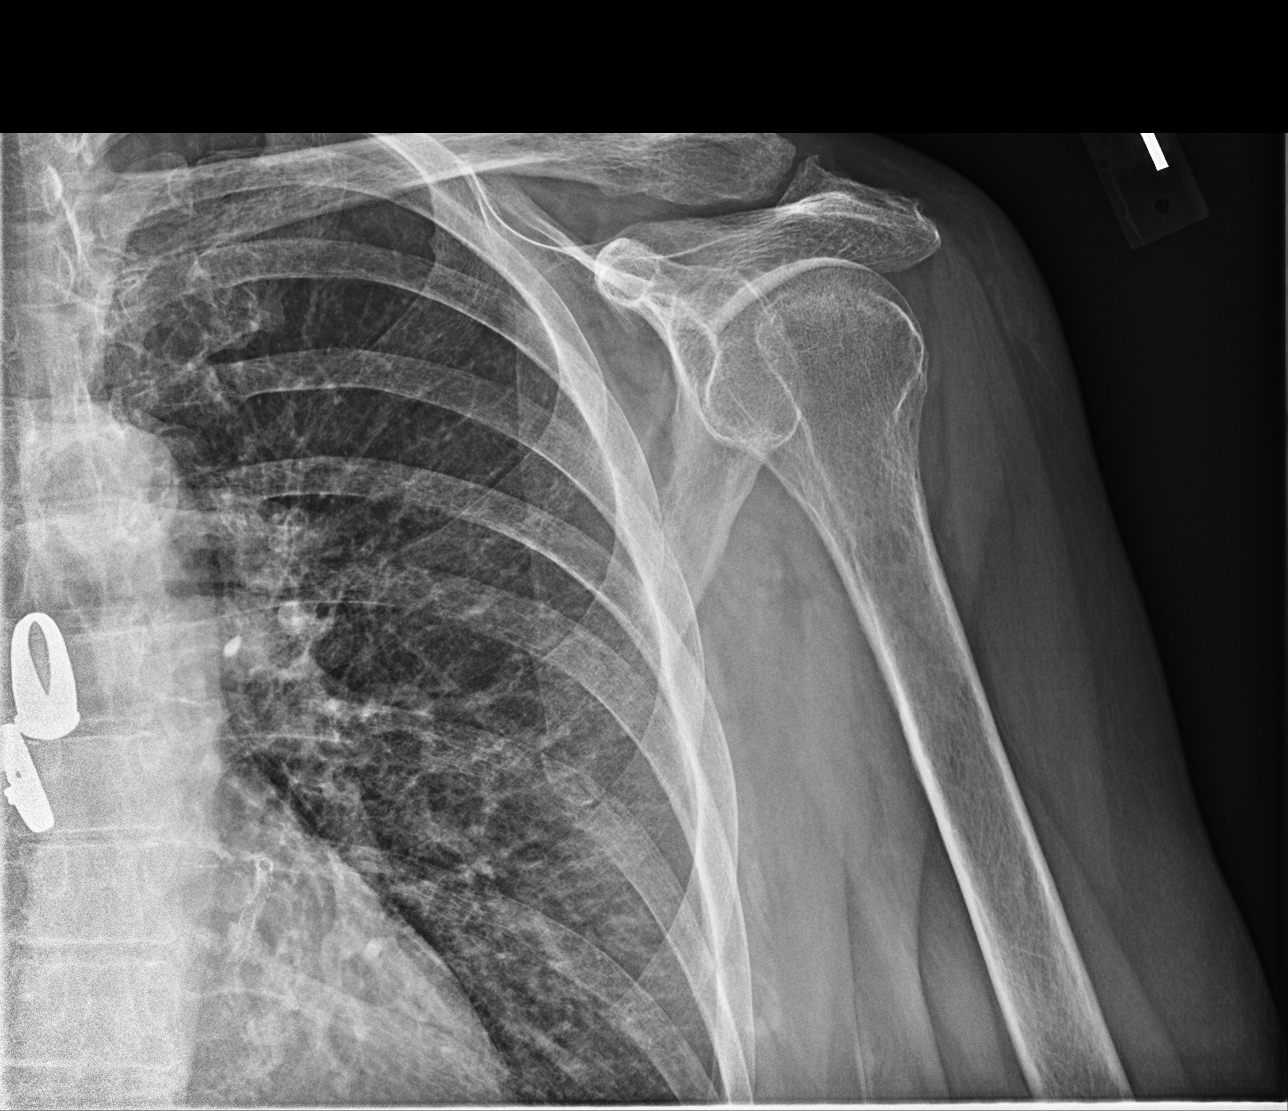
[im 2/4]
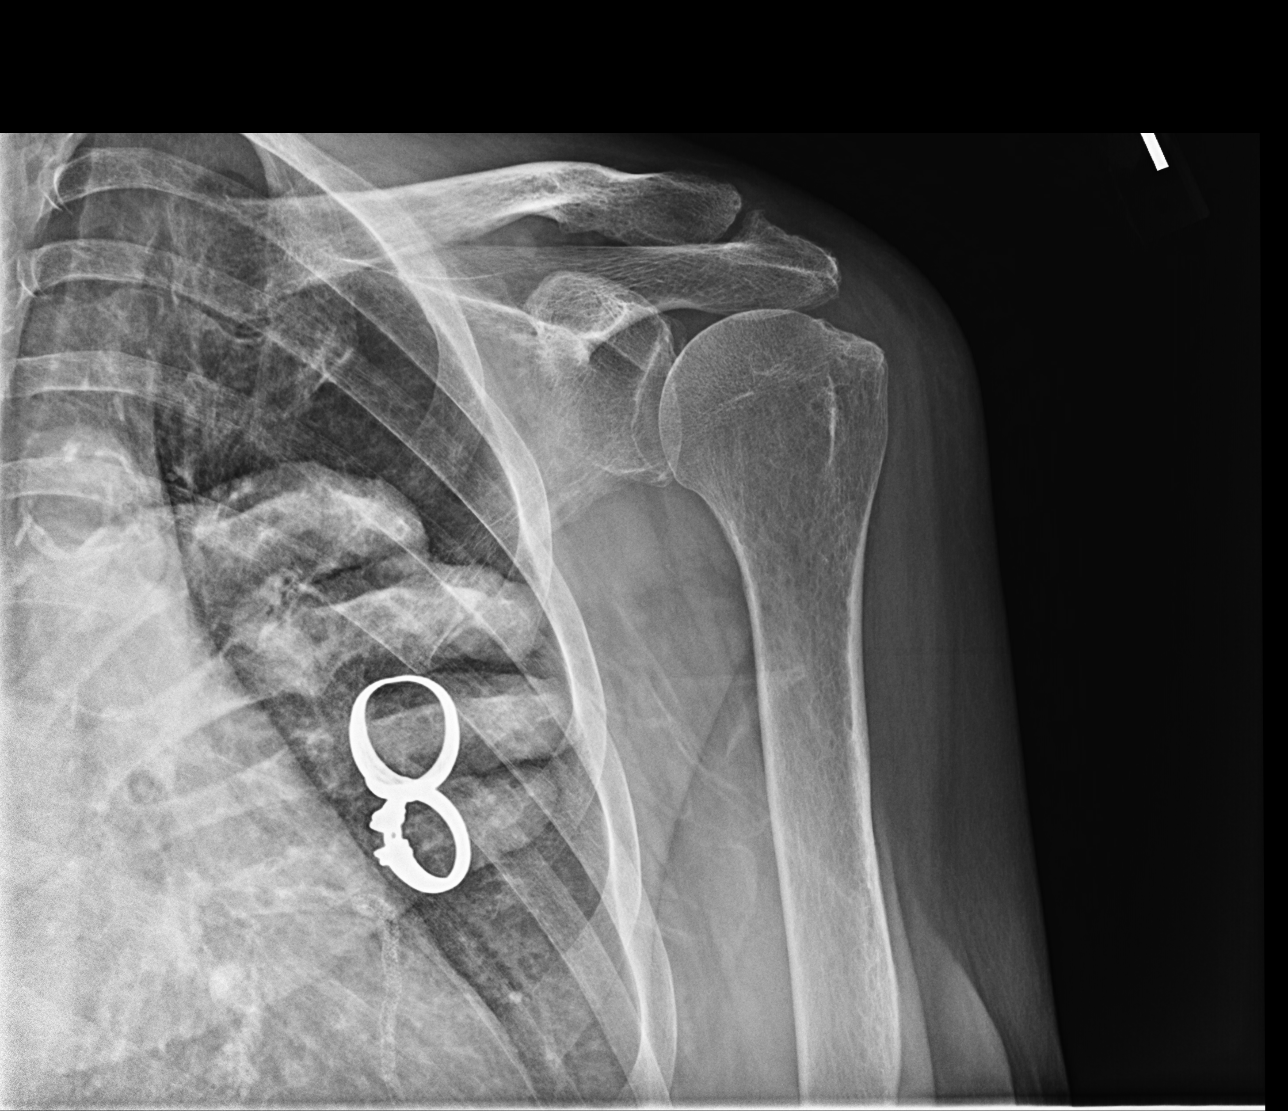
[im 3/4]
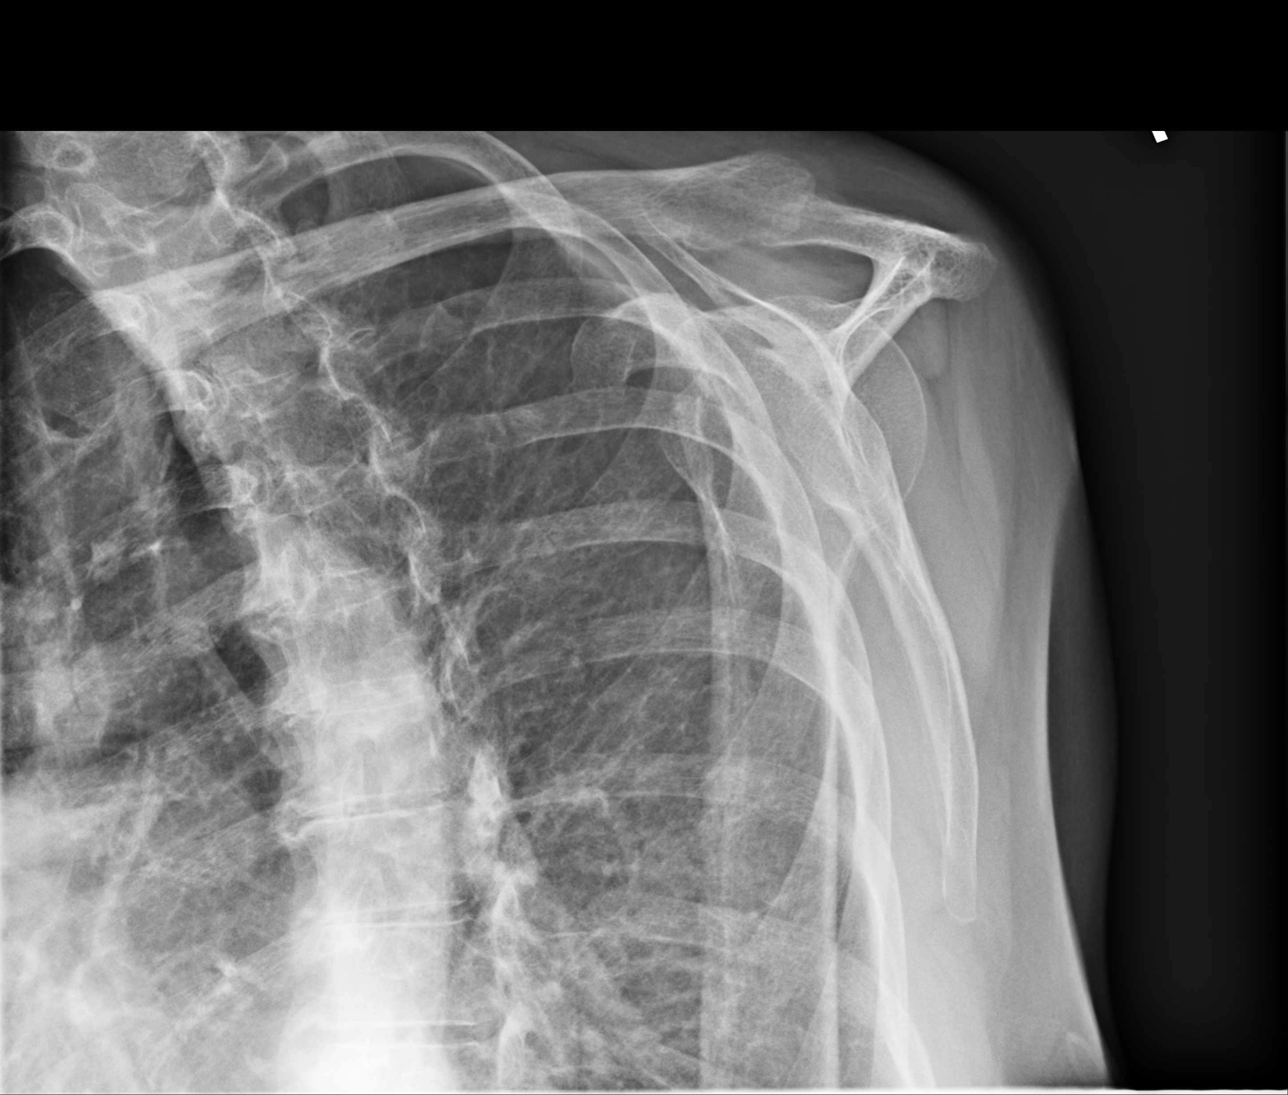
[im 4/4]
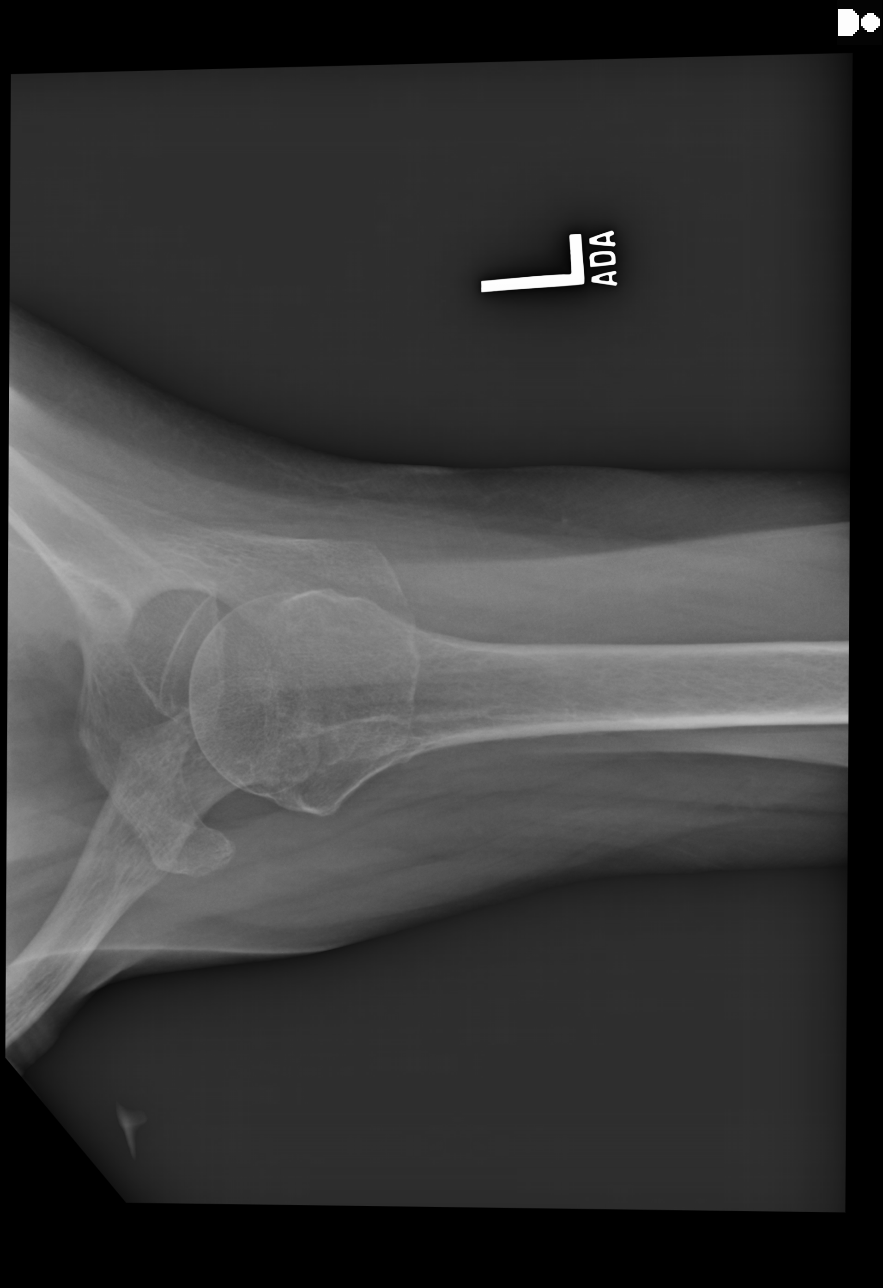

[4 of 4 positions shown; findings below may reference images not displayed]

FINDINGS: See impression.
IMPRESSION: No acute fracture or dislocation. Mild osteoarthritis of the glenohumeral joint.          
 Mild to moderate osteophytic arthritis of the acromioclavicular joint.

## 2021-05-22 IMAGING — CR DX Foot Complete RT
1 series · 3 of 3 positions shown · non-contrast
Comparison: none

3 views of the right foot
INDICATION: Foot pain                                                                     
 FINDINGS/impression: No acute fracture or dislocation is evident. Mild hallux             
 valgus deformity.                                                                         
 Osseous demineralization. Mild spurring at the tibiotalar articulation. Tiny              
 spurs at the talonavicular and calcaneocuboid articulation.                               
 Atherosclerotic calcifications are noted. Mild subcutaneous edema.

[Series 1: ap · 0.10mm/px · 3 of 3 slices shown]
[im 1/3]
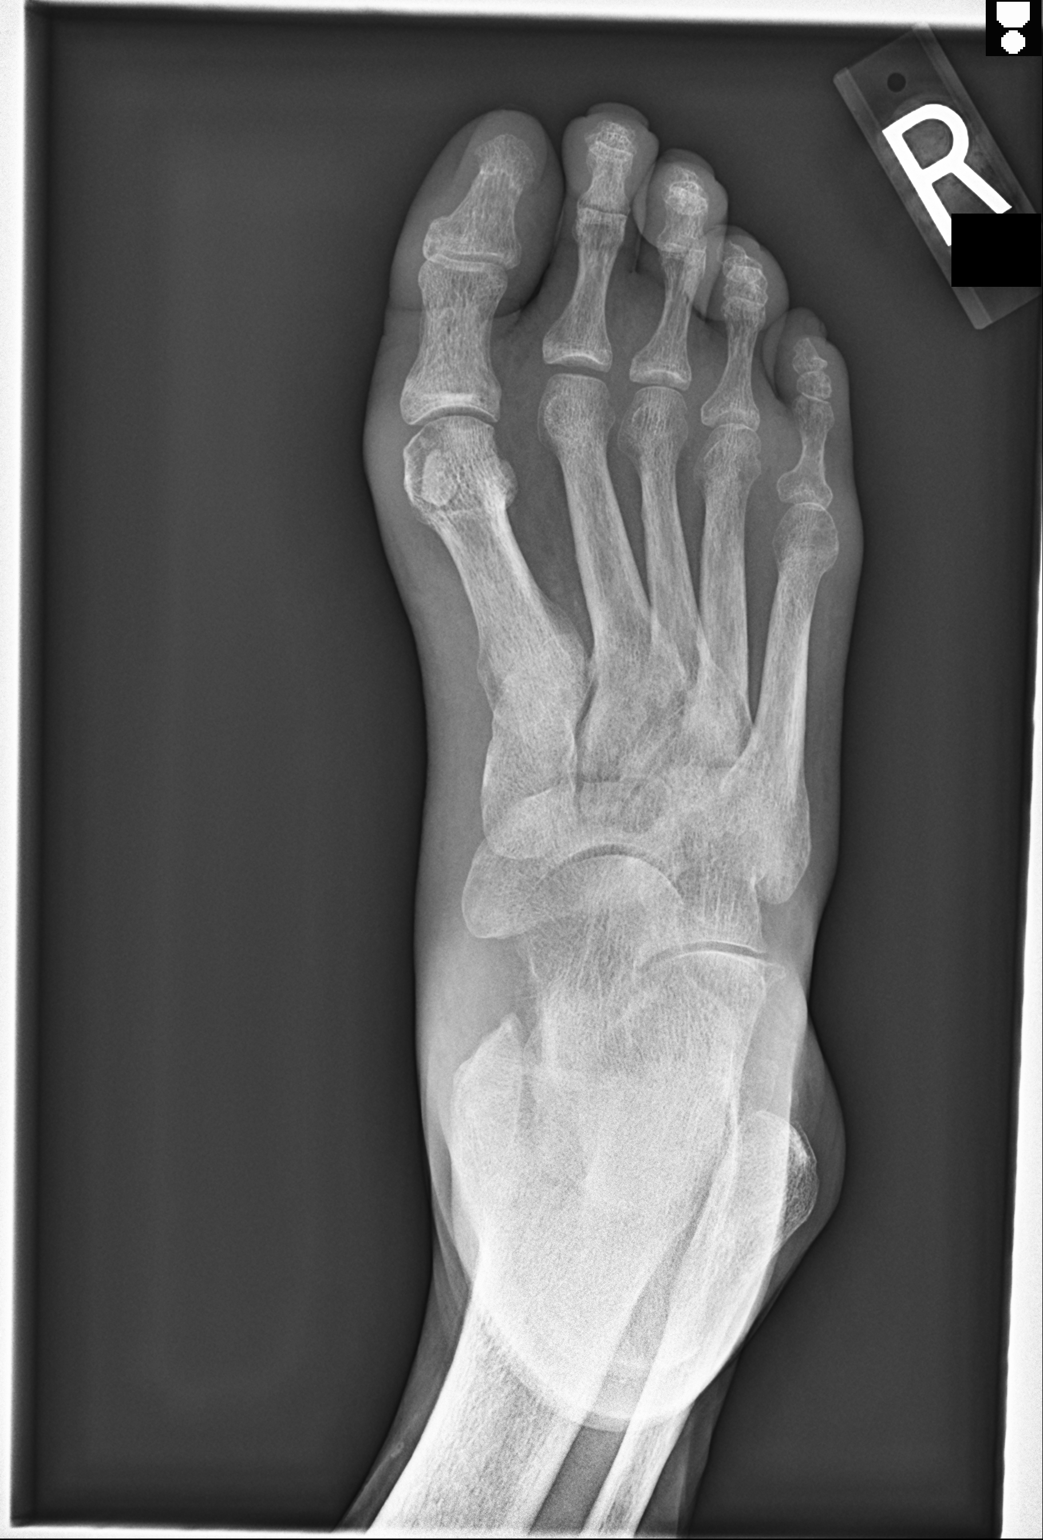
[im 2/3]
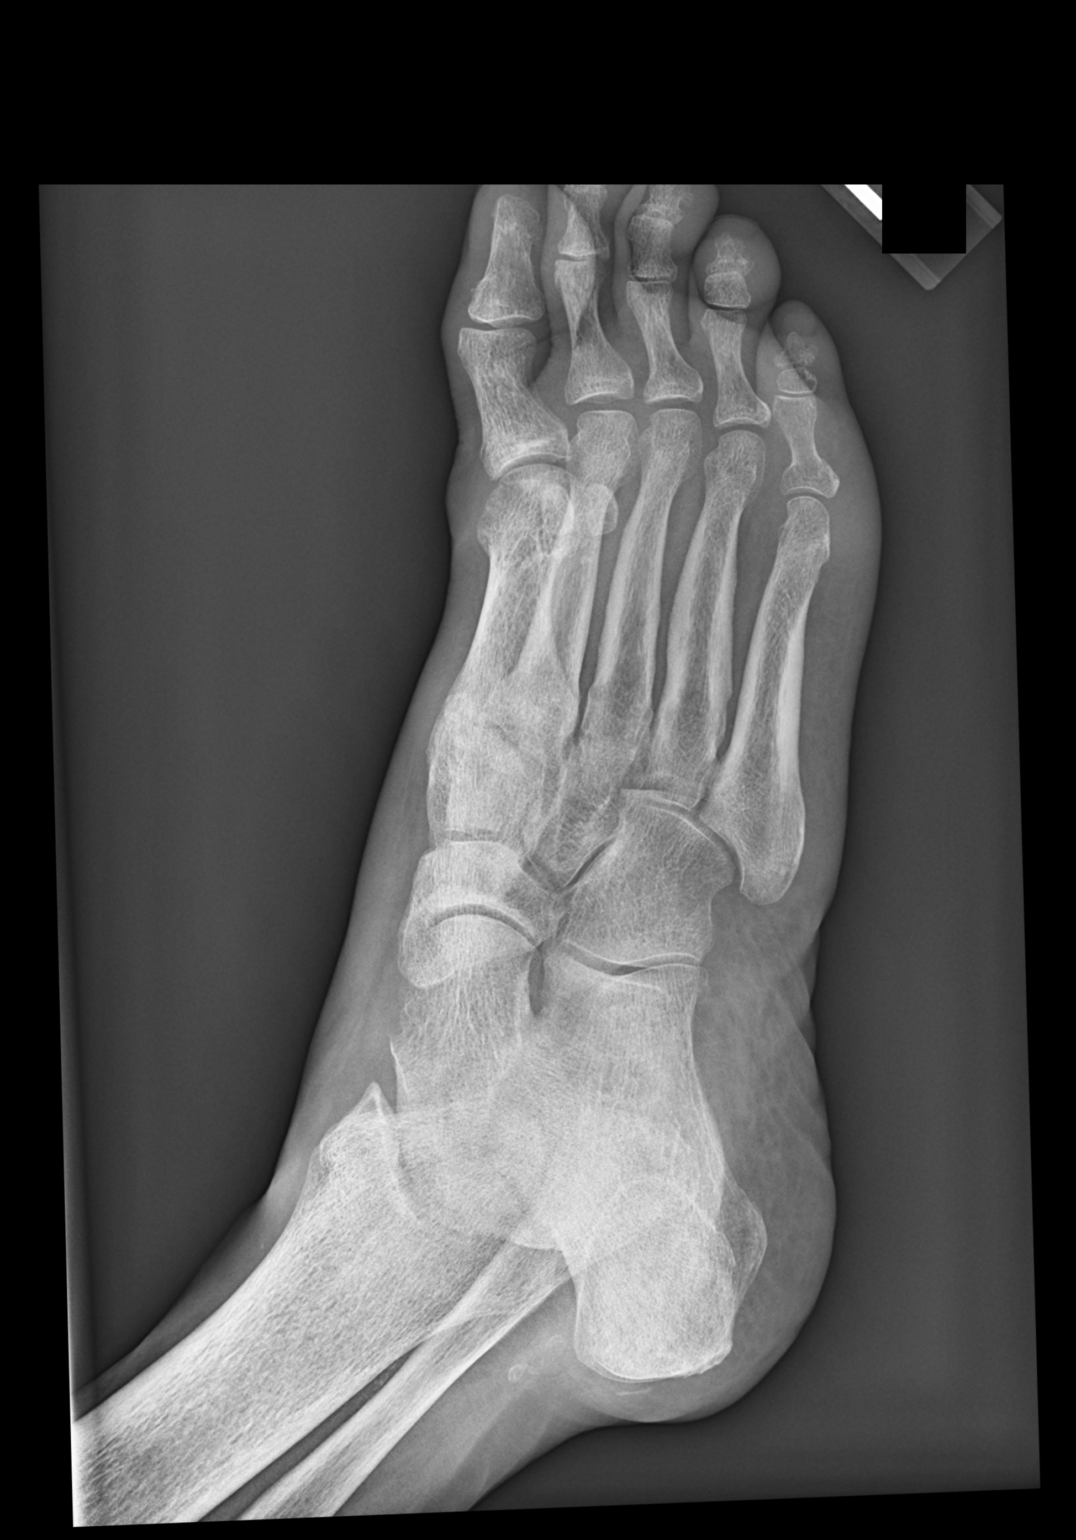
[im 3/3]
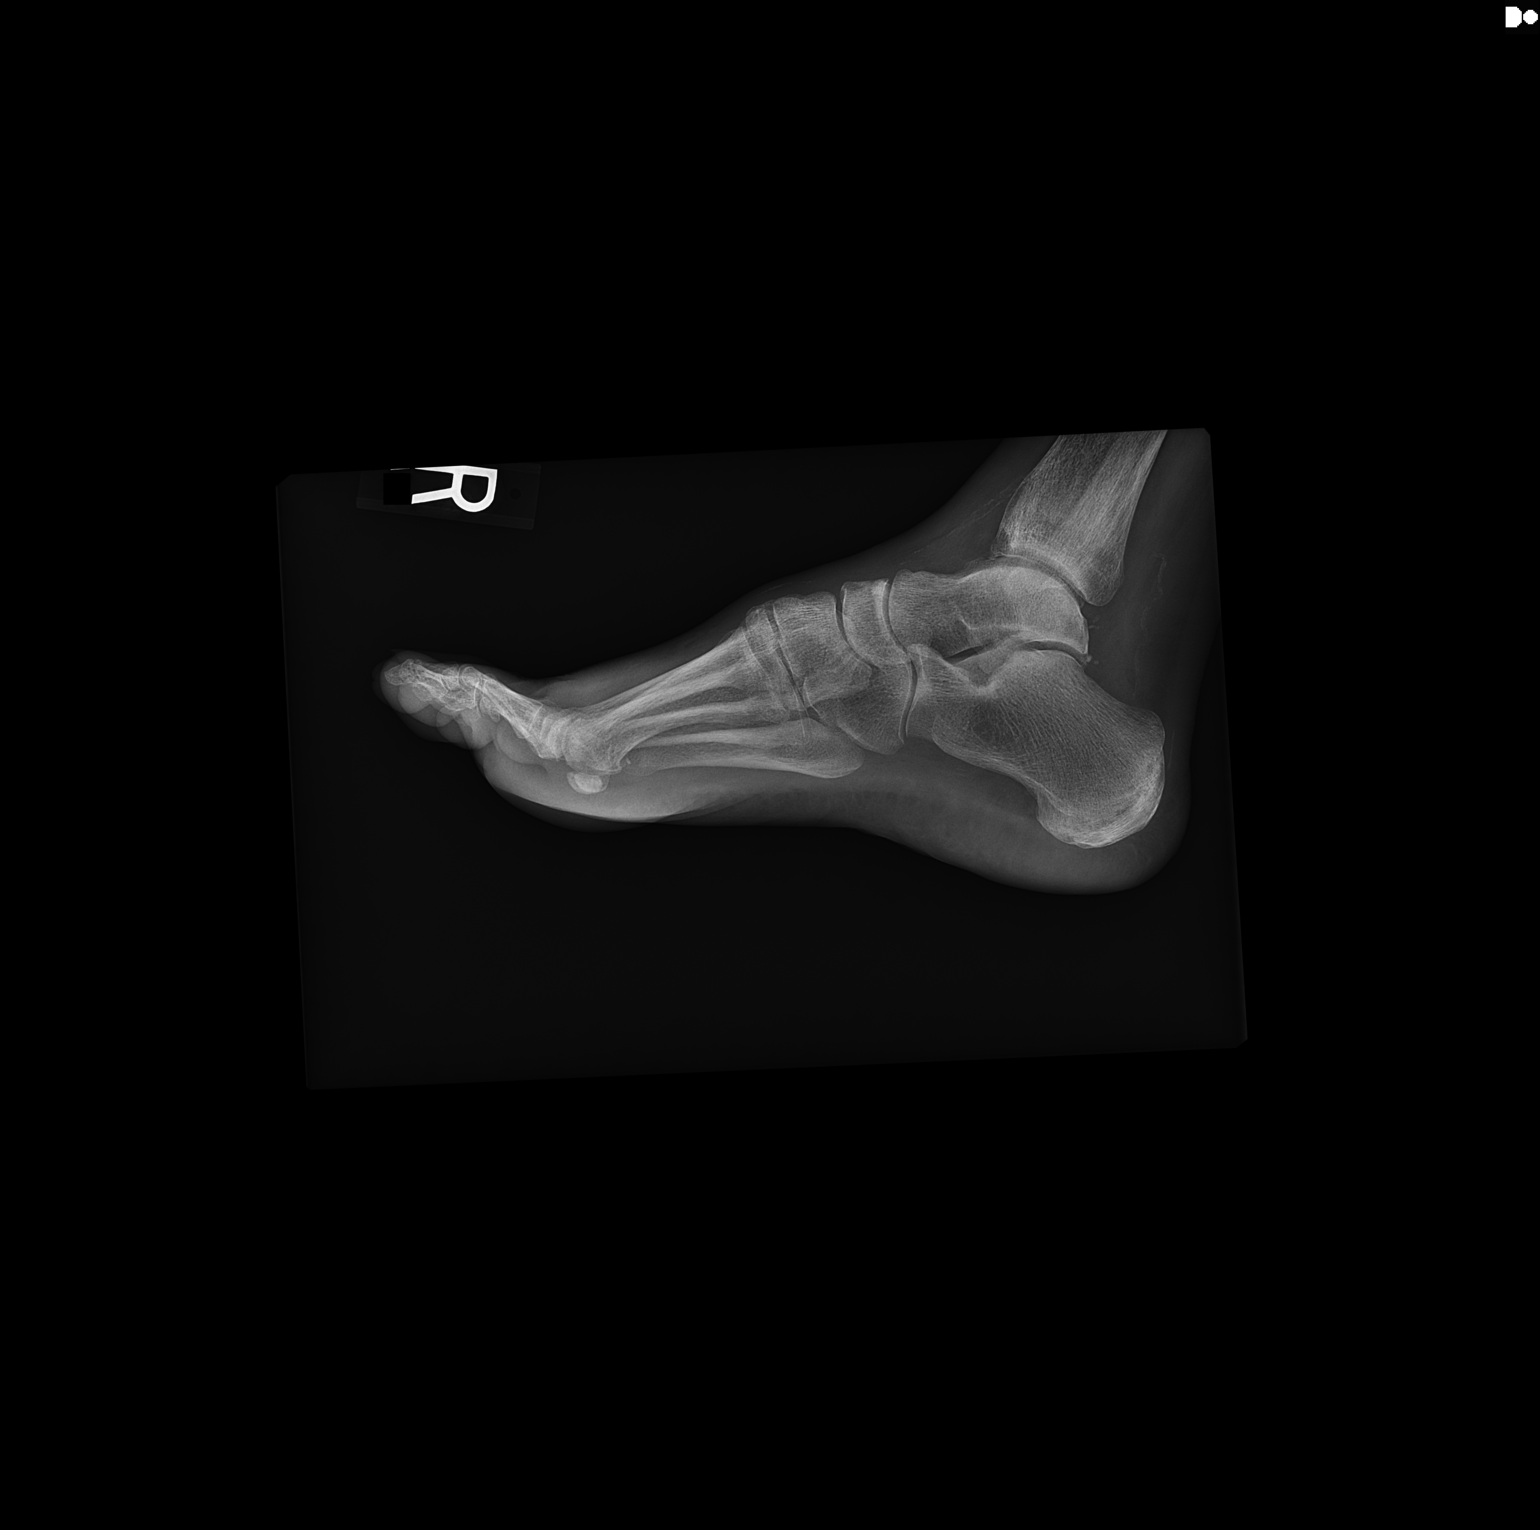

[3 of 3 positions shown; findings below may reference images not displayed]

## 2021-12-27 IMAGING — MR MR Lumbar Spine W W-O Contrast
5 of 10 series · 23 of 48 positions shown · IV contrast (dotarem)
Comparison: CT lumbar spine from 02/26/2020.

CLINICAL INFORMATION: Gait instability.
TECHNIQUE: MRI of the lumbar spine without and with contrast. 12 mL of Dotarem was        
 administered intravenously.

[Series 11: T2 · sagittal · 4.0mm · 0.68mm/px · 3 of 13 slices shown (1 of 3)]
[im 1/13]
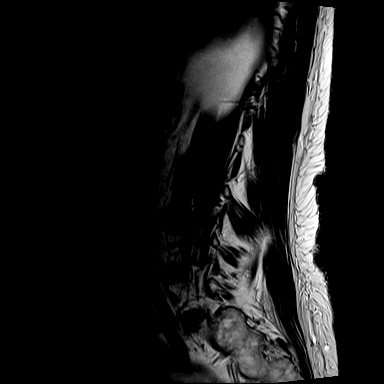
[im 7/13]
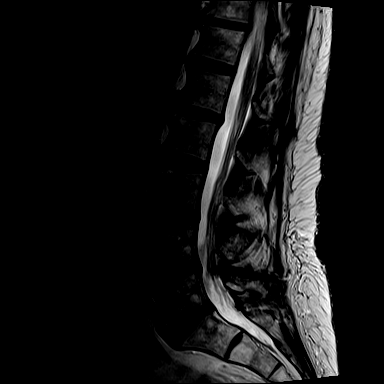
[im 13/13]
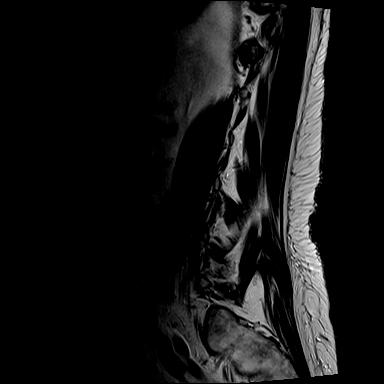

[Series 12: T1 · sagittal · 4.0mm · 0.68mm/px · 3 of 13 slices shown (1 of 2)]
[im 1/13]
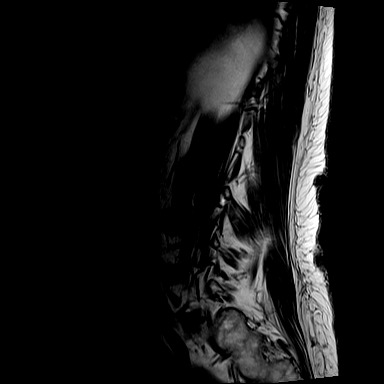
[im 7/13]
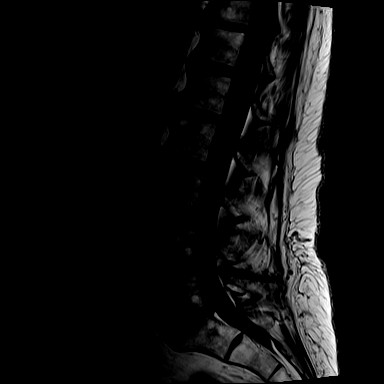
[im 13/13]
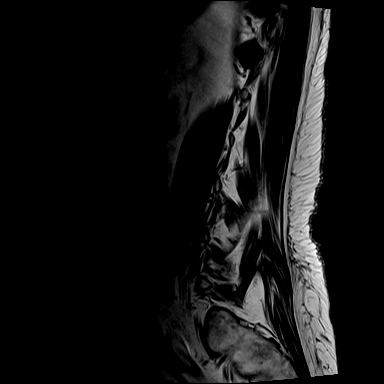

[Series 14: T2 · axial · 4.0mm · 0.56mm/px · z∈[-380,-267]mm · 6 of 24 slices shown (2 of 3)]
[im 1/24]
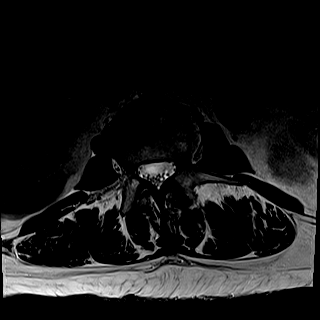
[im 5/24]
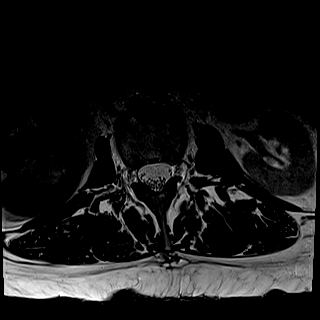
[im 10/24]
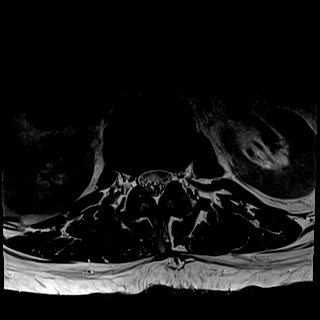
[im 14/24]
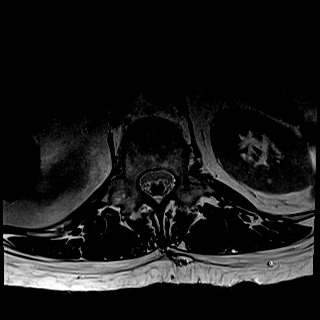
[im 19/24]
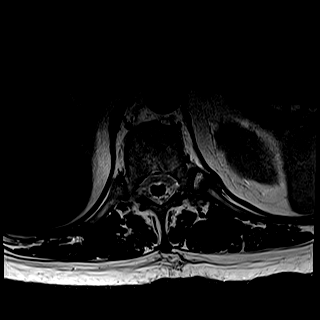
[im 24/24]
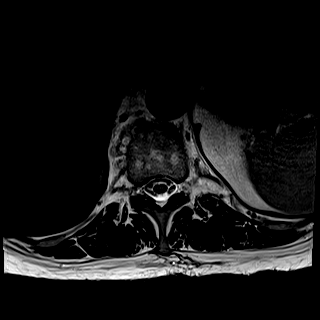

[Series 15: T1 · axial · non-contrast · 4.0mm · 0.28mm/px · z∈[-380,-292]mm · 5 of 24 slices shown (2 of 2)]
[im 1/24]
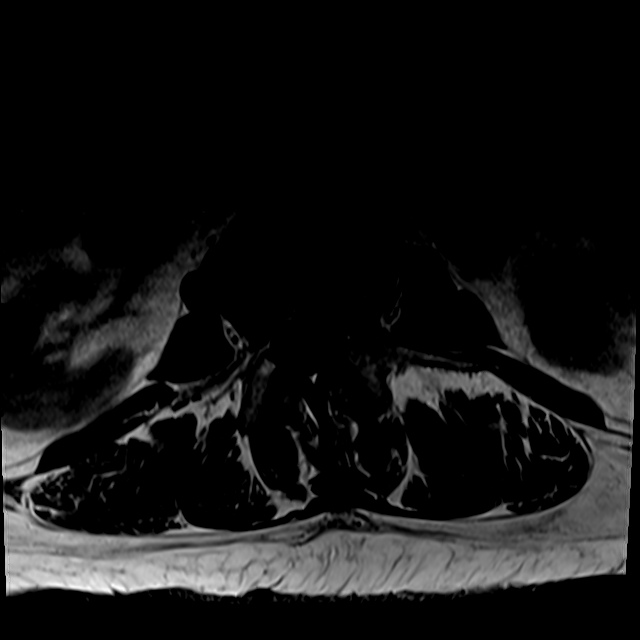
[im 5/24]
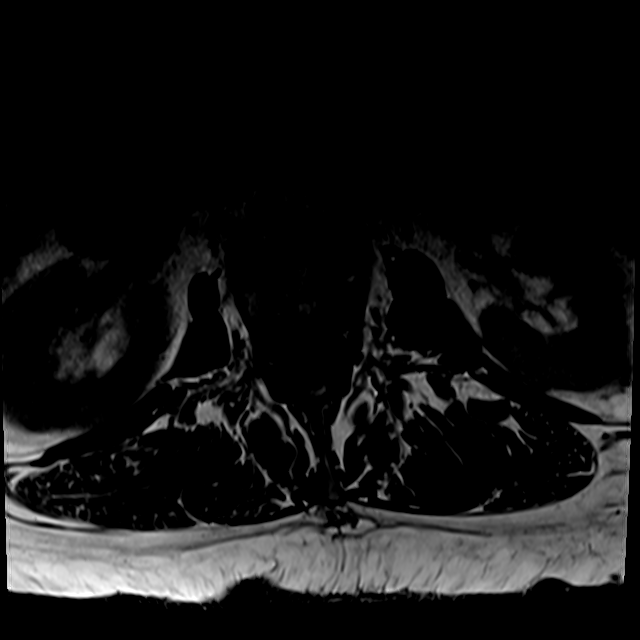
[im 10/24]
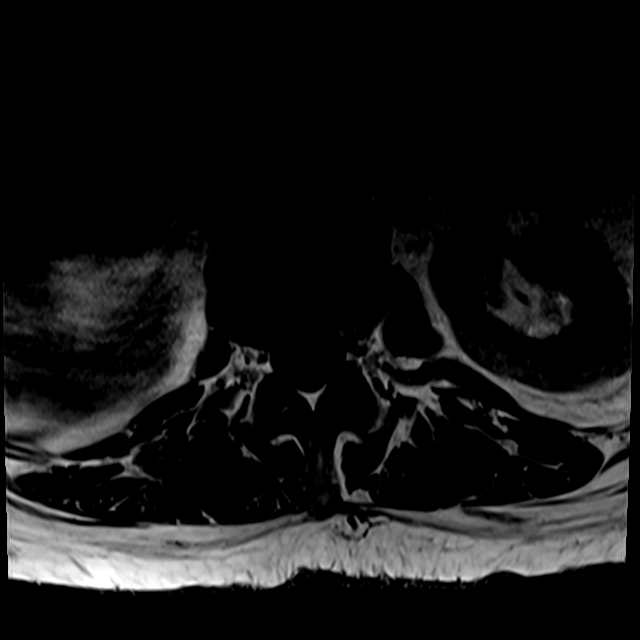
[im 14/24]
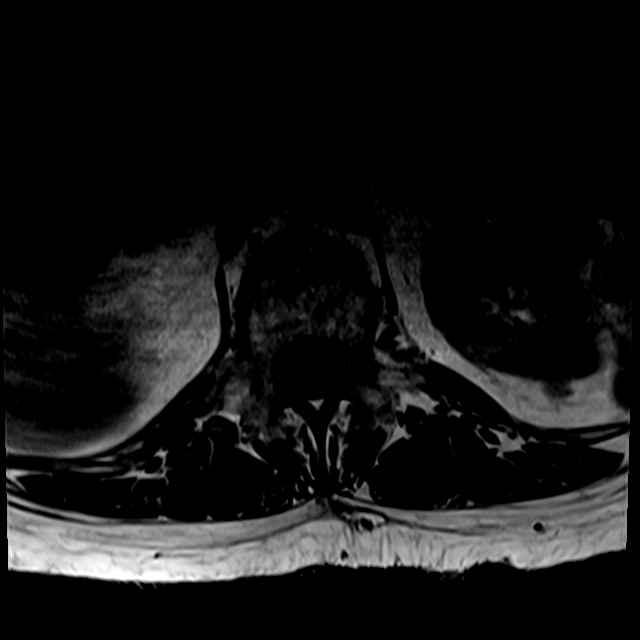
[im 19/24]
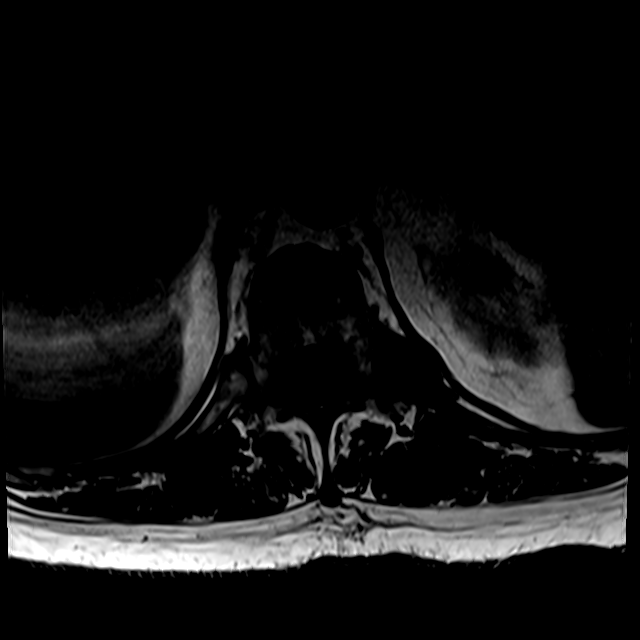

[Series 16: T2 · axial · 4.0mm · 0.56mm/px · z∈[-529,-415]mm · 6 of 24 slices shown (3 of 3)]
[im 1/24]
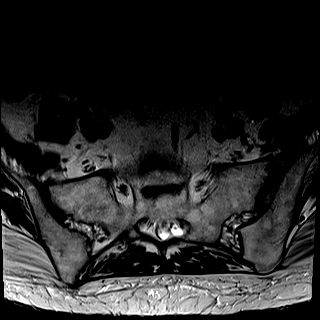
[im 5/24]
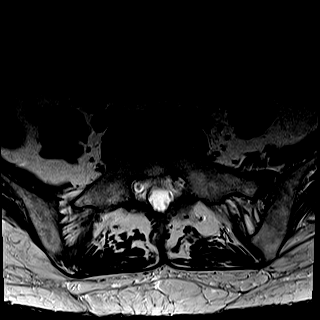
[im 10/24]
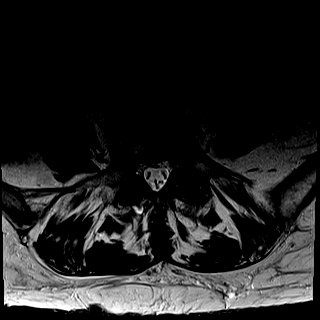
[im 14/24]
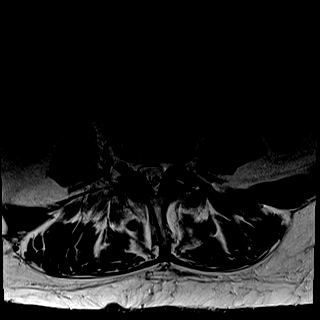
[im 19/24]
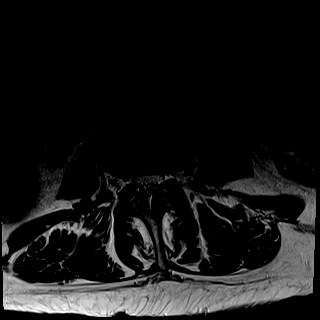
[im 24/24]
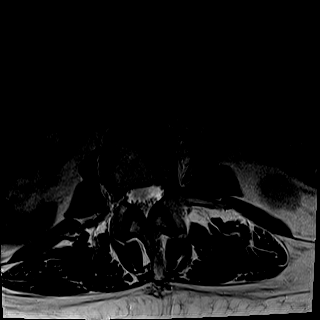

[23 of 48 positions shown; findings below may reference images not displayed]

FINDINGS: There are five lumbar-type nonrib-bearing vertebrae.                            
 There is grade 1 retrolisthesis of L1 on L2, similar to the prior study. Alignment is     
 otherwise within normal limits.                                                           
 The vertebral body heights are preserved. There is a nondisplaced fracture of the         
 posterior left 12th rib with associated intermediate T1 and T2 signal and enhancement     
 measuring 2.0 x 1.4 cm (series 19, image 7). There is partially imaged similar signal     
 abnormality and enhancement at the left T11 level. The bone marrow signal intensity is    
 otherwise within normal limits.                                                           
 There is degenerative change in the lumbar spine with diffuse disc desiccation.           
 The spinal cord is normal in signal and morphology. The conus medullaris terminates at    
 the L1-L2 level.                                                                          
 No abnormal enhancement in the spinal canal is identified.                                
 The following levels were imaged in the axial and sagittal planes:                        
 T11-T12: Mild disc bulge. No significant spinal canal or neuroforaminal                   
 stenosis.                                                                                 
 T12-L1: Facet arthrosis and ligament of flavum hypertrophy. No significant spinal canal   
 or neuroforaminal stenosis.                                                               
 L1-L2: Facet arthrosis and ligament of flavum hypertrophy. Mild disc bulge. No            
 significant spinal canal or neuroforaminal stenosis.                                      
 L2-L3: Facet arthrosis and ligamentum flavum hypertrophy. No significant spinal canal or  
 neuroforaminal stenosis.                                                                  
 L3-L4: Facet arthropathy and ligamentum hypertrophy. There is a high T2 signal structure  
 at the anteromedial aspect of the right facet joint measuring 4 x 3 x 12 mm (series 16,   
 image 5; series 11, image 8) suggestive of a synovial cyst. Mild disc bulge. Mild spinal  
 canal and bilateral neuroforaminal stenosis, similar to the prior study when accounting   
 for differences in technique.                                                             
 L4-L5: Facet arthrosis and ligament hypertrophy. Mild disc bulge. Moderate spinal canal   
 and bilateral neuroforaminal stenosis, similar to the prior study when accounting for     
 differences in technique.                                                                 
 L5-S1: Facet arthrosis. Mild disc bulge. No significant spinal canal stenosis. Mild to    
 moderate bilateral neuroforaminal stenosis, similar to the prior study when accounting    
 for differences in technique.                                                             
 There is mild high STIR signal in the posterior subcutaneous tissue compatible            
 nonspecific edema. The paravertebral soft tissues are otherwise grossly                   
 unremarkable.                                                                             
 There are bilateral small high T2 signal renal structures and a small low signal          
 structure in the right, which are incompletely characterized on this                      
 examination.
IMPRESSION: 1.  Multilevel degenerative change of the lumbar spine with up to moderate spinal canal   
 and neuroforaminal stenosis as described above.                                           
 2.  A fracture of the posterior left 12th rib with associated abnormal signal intensity   
 and enhancement, which represent an underlying lesion.                                    
 3.  Partially imaged abnormal signal and enhancement at the left T11 level.               
 4.  Other findings as described above.

## 2022-01-14 IMAGING — CT CT Brain W-O Contrast
3 of 4 series · 15 of 47 positions shown, 18 images · non-contrast
Comparison: None.

CT Brain W-O Contrast
INDICATION: Headaches.                                                                   
 Pertinent History:  Pt presents to ED via EMS for c/o AMS. Pt lives at home and EMS       
 reports they were called out for pt being confused. Per EMS arrival pt was acting normal. 
 Pt has hx of mini strokes, \T\ recent falls. Pt fell couple of days ago right wrist pain  
 since fall. No BT. Pt unable to report the year but reports Elie as president mutiple    
 bruising noted on arms.                                                                   
 Surgical History:  none                                                                   
 Cancer: None                                                                              
 Technologist Comments: None.
TECHNIQUE: Helical acquisition with sagittal and coronal reformats.                       
 Utilized dose reduction techniques include: Automated Exposure Control, vendor specific   
 iterative reconstruction technique

[Series 5: cor pre · coronal · non-contrast · 0.38mm/px · 3 of 67 slices shown]
[im 23/67  brain]
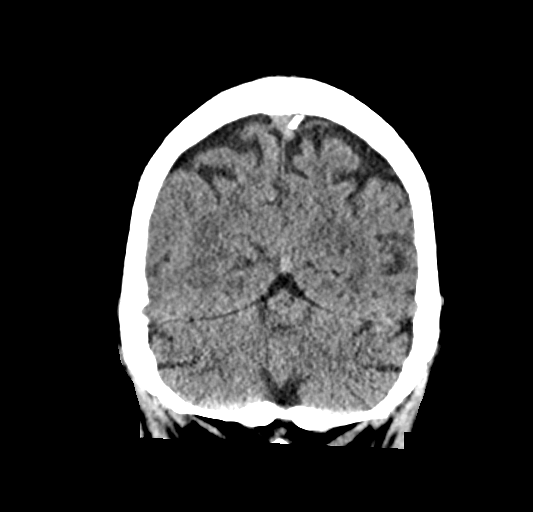
[im 30/67  brain]
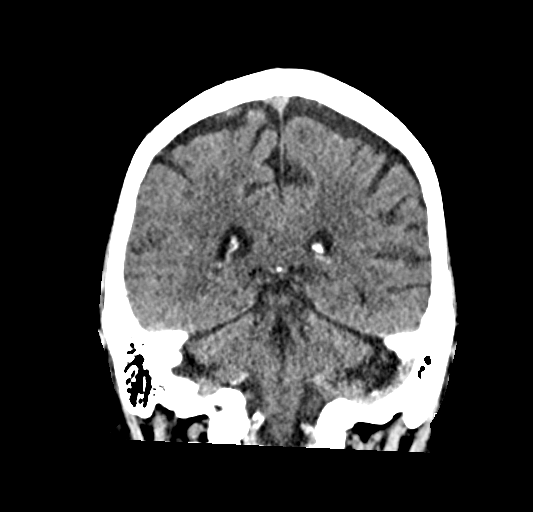
[im 37/67  brain]
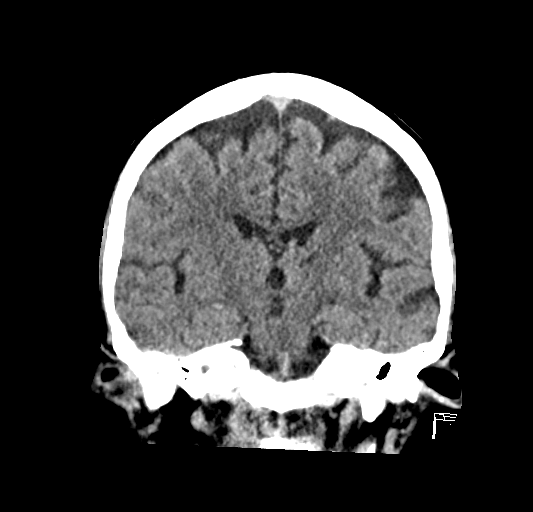

[Series 6: sag pre · sagittal · non-contrast · 0.41mm/px · 3 of 57 slices shown]
[im 19/57  brain]
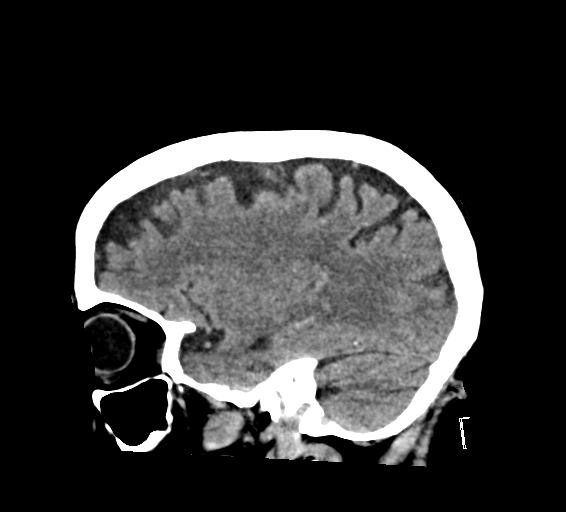
[im 29/57  brain]
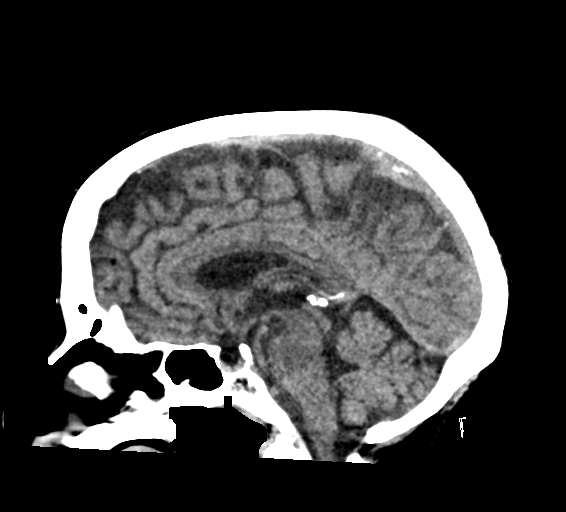
[im 38/57  brain]
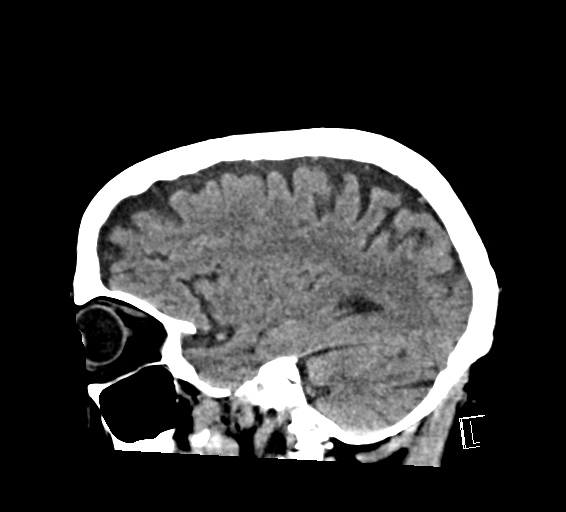

[Series 7: corr axial · axial · 0.37mm/px · z∈[+1125,+1281]mm · 9 of 62 slices shown, 12 images]
[im 5/62  brain]
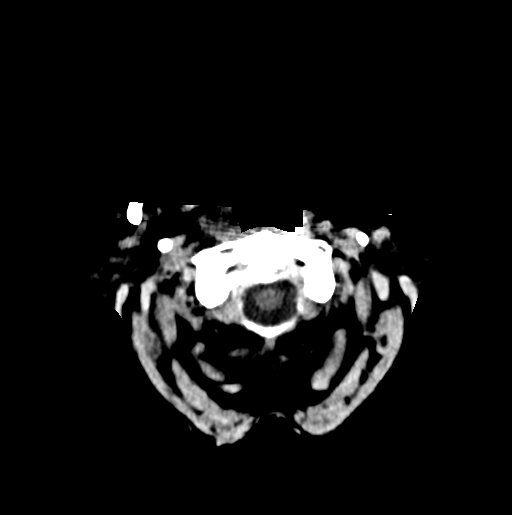
[im 5/62  bone]
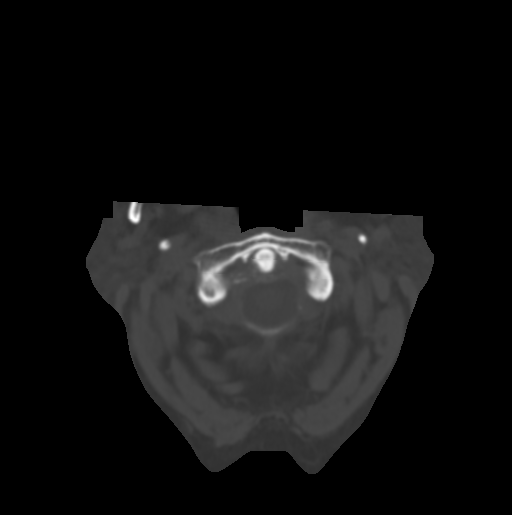
[im 13/62  brain]
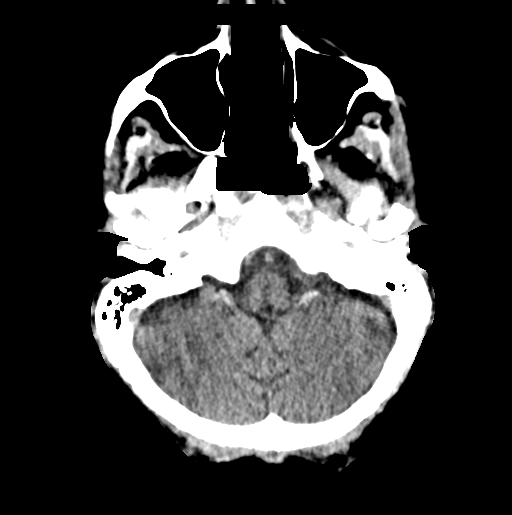
[im 17/62  brain]
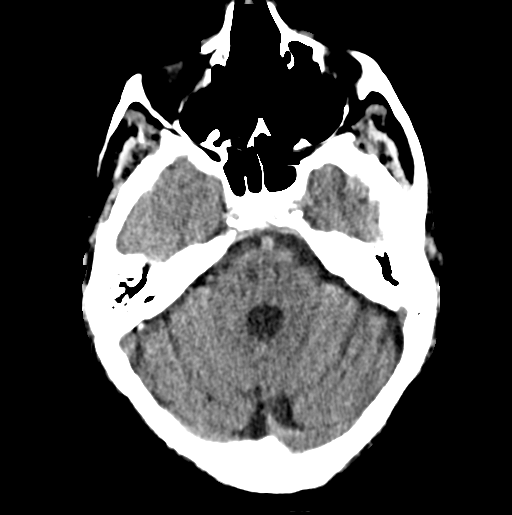
[im 25/62  brain]
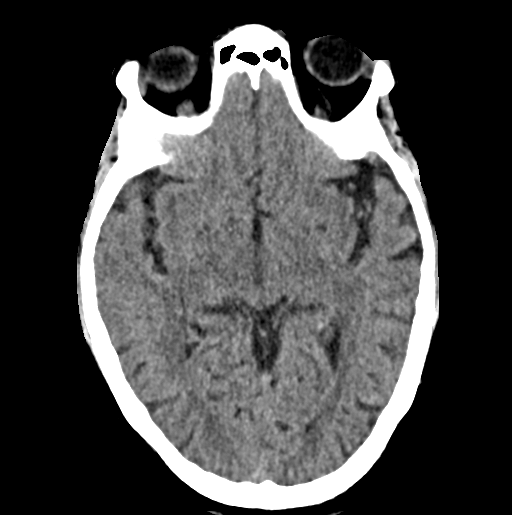
[im 33/62  brain]
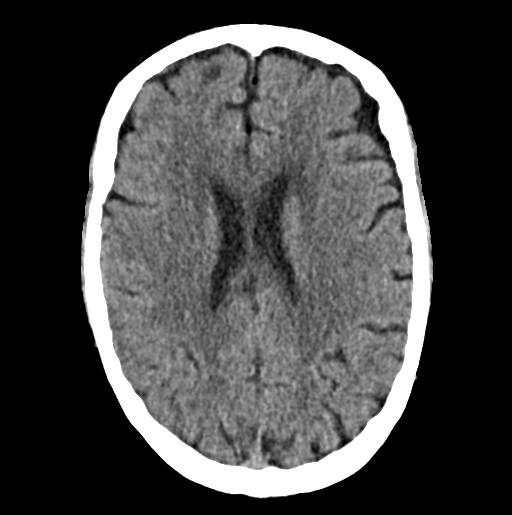
[im 33/62  bone]
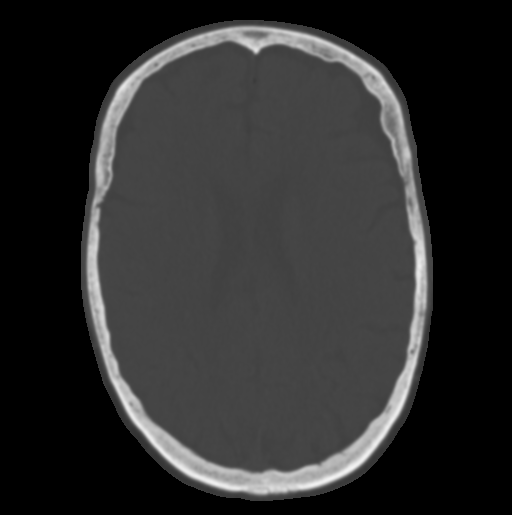
[im 37/62  brain]
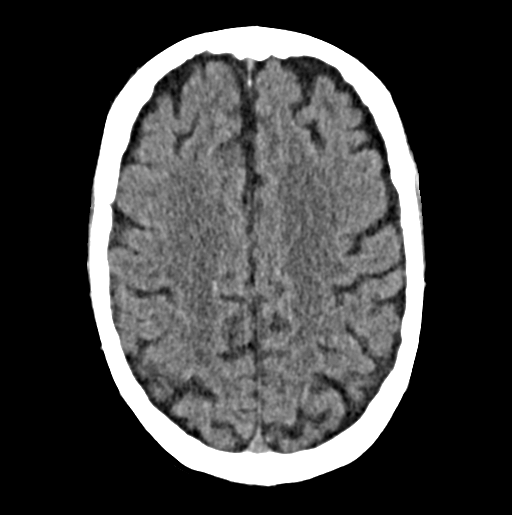
[im 45/62  brain]
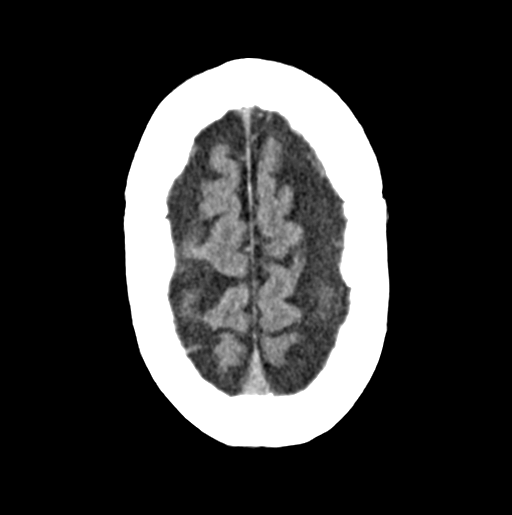
[im 49/62  brain]
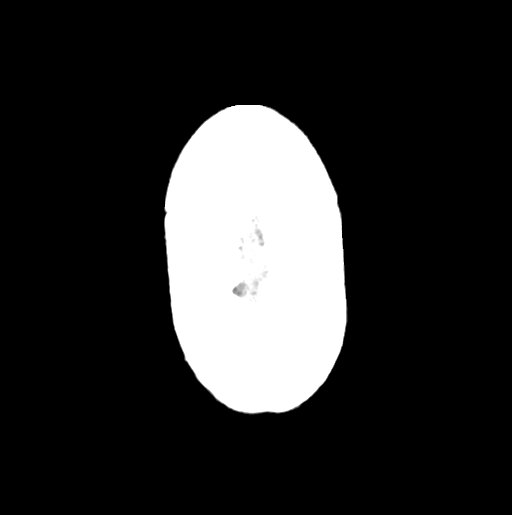
[im 57/62  brain]
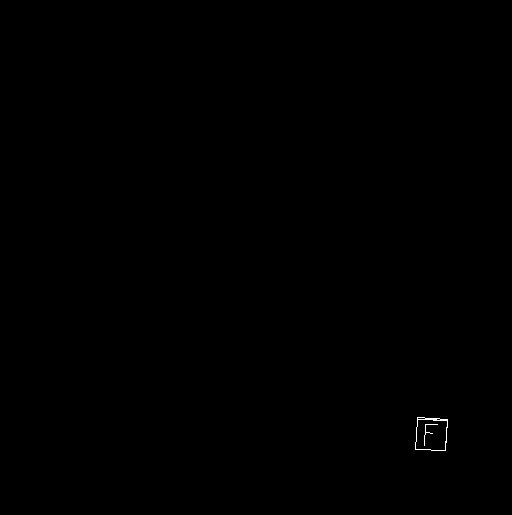
[im 57/62  bone]
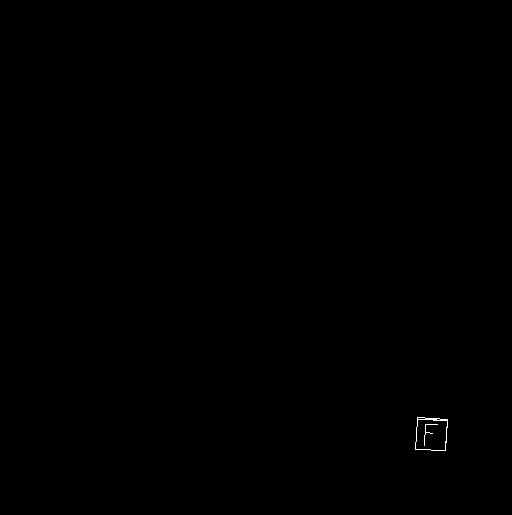

[15 of 47 positions shown; findings below may reference images not displayed]

FINDINGS: Chronic punctate infarcts in the bilateral thalami. No acute intracranial hemorrhage,     
 midline shift or mass effect. Mild to moderate generalized parenchymal volume loss.       
 Calvarium is intact. Paranasal sinuses and mastoid air cells are grossly aerated. Globes  
 and orbits are gross unremarkable.
IMPRESSION: 1. No acute intracranial abnormality.                                                     
 2. Chronic findings as above.
